# Patient Record
Sex: Female | Born: 1985 | Hispanic: Yes | Marital: Married | Smoking: Current every day smoker
Health system: Southern US, Community
[De-identification: ages and names within clinical notes are randomized; demographics above are authoritative.]

## PROBLEM LIST (undated history)

## (undated) DIAGNOSIS — J45909 Unspecified asthma, uncomplicated: Secondary | ICD-10-CM

## (undated) DIAGNOSIS — M5430 Sciatica, unspecified side: Secondary | ICD-10-CM

## (undated) DIAGNOSIS — M549 Dorsalgia, unspecified: Secondary | ICD-10-CM

## (undated) HISTORY — PX: TUBAL LIGATION: SHX77

## (undated) HISTORY — DX: Sciatica, unspecified side: M54.30

## (undated) HISTORY — DX: Unspecified asthma, uncomplicated: J45.909

## (undated) HISTORY — DX: Dorsalgia, unspecified: M54.9

---

## 2014-03-14 ENCOUNTER — Ambulatory Visit: Payer: Self-pay | Admitting: Physician Assistant

## 2014-03-14 LAB — URINALYSIS, COMPLETE
BILIRUBIN, UR: NEGATIVE
BLOOD: NEGATIVE
GLUCOSE, UR: NEGATIVE mg/dL (ref 0–75)
KETONE: NEGATIVE
LEUKOCYTE ESTERASE: NEGATIVE
Nitrite: NEGATIVE
PROTEIN: NEGATIVE
Ph: 6 (ref 4.5–8.0)
Specific Gravity: 1.025 (ref 1.003–1.030)

## 2014-03-14 LAB — PREGNANCY, URINE: PREGNANCY TEST, URINE: POSITIVE m[IU]/mL

## 2014-09-04 ENCOUNTER — Observation Stay: Payer: Self-pay | Admitting: Obstetrics and Gynecology

## 2014-10-31 ENCOUNTER — Inpatient Hospital Stay: Payer: Self-pay | Admitting: Obstetrics and Gynecology

## 2014-10-31 LAB — CBC WITH DIFFERENTIAL/PLATELET
BASOS ABS: 0 10*3/uL (ref 0.0–0.1)
Basophil %: 0.3 %
EOS ABS: 0.1 10*3/uL (ref 0.0–0.7)
Eosinophil %: 0.8 %
HCT: 32.3 % — ABNORMAL LOW (ref 35.0–47.0)
HGB: 10.5 g/dL — ABNORMAL LOW (ref 12.0–16.0)
Lymphocyte #: 3.4 10*3/uL (ref 1.0–3.6)
Lymphocyte %: 21.2 %
MCH: 31.2 pg (ref 26.0–34.0)
MCHC: 32.5 g/dL (ref 32.0–36.0)
MCV: 96 fL (ref 80–100)
Monocyte #: 1.3 x10 3/mm — ABNORMAL HIGH (ref 0.2–0.9)
Monocyte %: 7.9 %
NEUTROS PCT: 69.8 %
Neutrophil #: 11.1 10*3/uL — ABNORMAL HIGH (ref 1.4–6.5)
PLATELETS: 207 10*3/uL (ref 150–440)
RBC: 3.37 10*6/uL — ABNORMAL LOW (ref 3.80–5.20)
RDW: 13.5 % (ref 11.5–14.5)
WBC: 16 10*3/uL — ABNORMAL HIGH (ref 3.6–11.0)

## 2014-11-01 LAB — CBC WITH DIFFERENTIAL/PLATELET
BASOS PCT: 0.3 %
Basophil #: 0.2 10*3/uL — ABNORMAL HIGH (ref 0.0–0.1)
Basophil #: 0 10*3/uL (ref 0.0–0.1)
Basophil %: 1 %
EOS ABS: 0.1 10*3/uL (ref 0.0–0.7)
Eosinophil #: 0.1 10*3/uL (ref 0.0–0.7)
Eosinophil %: 0.8 %
Eosinophil %: 0.7 %
HCT: 20.5 % — ABNORMAL LOW (ref 35.0–47.0)
HCT: 20 % — AB (ref 35.0–47.0)
HGB: 6.7 g/dL — ABNORMAL LOW (ref 12.0–16.0)
HGB: 6.5 g/dL — AB (ref 12.0–16.0)
LYMPHS PCT: 20 %
Lymphocyte #: 2.3 10*3/uL (ref 1.0–3.6)
Lymphocyte #: 3.8 10*3/uL — ABNORMAL HIGH (ref 1.0–3.6)
Lymphocyte %: 13.3 %
MCH: 30.9 pg (ref 26.0–34.0)
MCH: 31 pg (ref 26.0–34.0)
MCHC: 32.4 g/dL (ref 32.0–36.0)
MCHC: 32.7 g/dL (ref 32.0–36.0)
MCV: 95 fL (ref 80–100)
MCV: 95 fL (ref 80–100)
MONO ABS: 1.5 x10 3/mm — AB (ref 0.2–0.9)
MONOS PCT: 8.6 %
Monocyte #: 1.6 x10 3/mm — ABNORMAL HIGH (ref 0.2–0.9)
Monocyte %: 8.9 %
Neutrophil #: 13.1 10*3/uL — ABNORMAL HIGH (ref 1.4–6.5)
Neutrophil #: 13.5 10*3/uL — ABNORMAL HIGH (ref 1.4–6.5)
Neutrophil %: 76 %
Neutrophil %: 70.4 %
PLATELETS: 162 10*3/uL (ref 150–440)
Platelet: 182 10*3/uL (ref 150–440)
RBC: 2.15 10*6/uL — ABNORMAL LOW (ref 3.80–5.20)
RBC: 2.11 10*6/uL — ABNORMAL LOW (ref 3.80–5.20)
RDW: 13.6 % (ref 11.5–14.5)
RDW: 13.5 % (ref 11.5–14.5)
WBC: 17.3 10*3/uL — AB (ref 3.6–11.0)
WBC: 19.2 10*3/uL — AB (ref 3.6–11.0)

## 2014-11-01 LAB — HEMATOCRIT: HCT: 22.1 % — ABNORMAL LOW (ref 35.0–47.0)

## 2014-11-01 LAB — APTT: Activated PTT: 28 secs (ref 23.6–35.9)

## 2014-11-01 LAB — PROTIME-INR
INR: 1
PROTHROMBIN TIME: 12.6 s (ref 11.5–14.7)

## 2014-11-02 LAB — HEMOGLOBIN: HGB: 10.1 g/dL — ABNORMAL LOW (ref 12.0–16.0)

## 2015-01-17 DIAGNOSIS — N946 Dysmenorrhea, unspecified: Secondary | ICD-10-CM | POA: Insufficient documentation

## 2015-01-17 DIAGNOSIS — R109 Unspecified abdominal pain: Secondary | ICD-10-CM | POA: Insufficient documentation

## 2015-01-17 DIAGNOSIS — R200 Anesthesia of skin: Secondary | ICD-10-CM | POA: Insufficient documentation

## 2015-01-17 DIAGNOSIS — M6740 Ganglion, unspecified site: Secondary | ICD-10-CM | POA: Insufficient documentation

## 2015-01-17 DIAGNOSIS — K5900 Constipation, unspecified: Secondary | ICD-10-CM | POA: Insufficient documentation

## 2015-01-17 DIAGNOSIS — Z98891 History of uterine scar from previous surgery: Secondary | ICD-10-CM | POA: Insufficient documentation

## 2015-01-17 DIAGNOSIS — R102 Pelvic and perineal pain: Secondary | ICD-10-CM | POA: Insufficient documentation

## 2015-01-17 DIAGNOSIS — O99210 Obesity complicating pregnancy, unspecified trimester: Secondary | ICD-10-CM | POA: Insufficient documentation

## 2015-02-09 LAB — SURGICAL PATHOLOGY

## 2015-02-15 NOTE — Op Note (Signed)
PATIENT NAME:  Leslie Rojas, Leslie Rojas MR#:  [id number] DATE OF BIRTH:  May 13, 1986  DATE OF PROCEDURE:  10/31/2014  PREOPERATIVE DIAGNOSES:  1.  History of prior cesarean section.  2.  Multiparity, desiring permanent sterilization.   POSTOPERATIVE DIAGNOSES: 1.  History of prior cesarean section.  2.  Multiparity, desiring permanent sterilization.     OPERATION: Repeat low transverse cesarean section with bilateral post partum sterilization.   ANESTHESIA: Spinal.   SURGEON: Hildred LaserAnika Jonuel Butterfield, MD   ESTIMATED BLOOD LOSS:  200 mL.  OPERATIVE FLUIDS: 1100 mL.   URINE OUTPUT: 200 mL.   COMPLICATIONS: None.   FINDINGS:  Infant cephalic presentation. There was clear amniotic fluid at rupture. Nuchal cord x 1 was noted which was reducible. Normal-appearing uterine outlying, tubes and ovaries.  Infant was 2880 grams, female, with Apgars of 9 and 10 at one and five minutes, respectively. Normal appearing placenta with 3 vessel cord.  SPECIMENS: Cord blood.   CONDITION: Stable   PROCEDURE: The patient was taken to the Operating Room where she was placed under spinal anesthesia without difficulty. She was then prepped and draped in normal sterile fashion and placed in the supine position with a left lateral tilt. After testing, spinal anesthesia was noted to be adequate. A Pfannenstiel skin incision was then made and carried down through the subcutaneous tissue to the fascia. Fascial incision was made and extended transversely. The fascia was separated from the underlying rectus tissue superiorly and inferiorly. The peritoneum was identified and entered. Peritoneal incision was extended longitudinally the bladder blade was then inserted.  The ureterovesical peritoneal reflection was then incised transversely and the bladder flap was bluntly freed from the lower uterine segment.  The bladder blade was then reinserted. Next, a low transverse uterine incision was made along the lower uterine segment with the scalpel.  The uterine incision was then extended laterally and superiorly bluntly. The bladder blade was removed and the infant's head was delivered atraumatically. There was a nuchal cord noted x 1 which was reducible. The nose and mouth were then suctioned. The infant was delivered from vertex presentation at 2880 grams with Apgars of 9 and 10.  Infant was handed off to the pediatricians in attendance after the umbilical cord was clamped and cut, cord blood was obtained for evaluation. The placenta was removed manually and intact and appeared normal. The uterus was exteriorized and cleared of all clots and debris. Uterine outlying tubes and ovaries appeared normal. The uterine incision was closed with a running locked suture of 0 Vicryl, a second suture of 0 Vicryl was used for an imbricating layer. Hemostasis was observed.   Attention was then turned to the patient's right fallopian tube which was then identified and grasped with Babcock clamp. The tube was then followed up to the fimbria. The Babcock clamp was then used to grasp the tube approximately 4 cm from the cornual region. A 3 cm segment of tube was then ligated with a free tie of plain gut using the Parkland technique and excised. The left fallopian tube was then ligated in a similar fashion and excised. There was some bleeding noted at the area of ligation, and so a 3-0 Vicryl was used to reapproximate the mesosalpinx in a running fashion. Hemostasis was observed at this point.  The uterus was then returned to the abdomen and lavage was carried out until clear.  The gutters were cleared of all clots and debris. The peritoneum was then closed using a 3-0 Vicryl in a running  fashion. The fascia was closed using a 1-0 Vicryl in a running fashion.  The skin was closed using a subcuticular stitch of 4-0 Monocryl.  LiquiBand was then applied over the incision. A Lidoderm patch was then placed above and below the incision.  The patient tolerated the procedure well.   She was transferred to the PACU in stable condition. All instrument, sponge, and needle counts were correct x 2 at the end of the procedure. She received 2 grams of Ancef prior to skin incision.       ____________________________ Jacques Earthly Valentino Saxon, MD asc:DT D: 11/01/2014 14:45:09 ET T: 11/01/2014 15:16:32 ET JOB#: [id number]  cc: Jacques Earthly. Valentino Saxon, MD, <Dictator> Fabian November MD ELECTRONICALLY SIGNED 11/10/2014 11:09

## 2015-03-23 ENCOUNTER — Telehealth: Payer: Self-pay | Admitting: Obstetrics and Gynecology

## 2015-03-23 DIAGNOSIS — R7989 Other specified abnormal findings of blood chemistry: Secondary | ICD-10-CM

## 2015-03-23 DIAGNOSIS — L659 Nonscarring hair loss, unspecified: Secondary | ICD-10-CM

## 2015-03-23 NOTE — Telephone Encounter (Signed)
This pt should not have been rescheduled, she needs to come in and discuss more long term management for pain instead of oxycodone. She needs to be placed back on the schedule, she is NOT getting any more narcotics until she is seen. Thanks.

## 2015-03-24 ENCOUNTER — Encounter: Payer: Self-pay | Admitting: Obstetrics and Gynecology

## 2015-03-30 ENCOUNTER — Telehealth: Payer: Self-pay | Admitting: Obstetrics and Gynecology

## 2015-03-30 NOTE — Telephone Encounter (Signed)
Patient needs to know if pain med has been called in or changed .Marland Kitchen Wants to pick it up today.Marland Kitchen

## 2015-03-31 NOTE — Telephone Encounter (Signed)
Dr.Cherry it was my understanding that this pt was not getting any more narcotics until she was seen by you to come up with a more long term plan, is that correct because the pt is calling asking about her script being ready. Please advise.

## 2015-03-31 NOTE — Telephone Encounter (Signed)
Per Dr.Cherry she would like this pt to be seen before giving any more pain medication. This pt was scheduled to come in this week for an annual but it was cancelled due to [employer], can we please add the pt back to the schedule for a medication follow up and apologize for the confusion. Thanks

## 2015-03-31 NOTE — Telephone Encounter (Signed)
Yes, that is correct. However patient was supposed to be on the schedule for tomorrow, but was moved out to August (due to [facility]...), so either we need to have the patient's appointment moved back up to a sooner date (which I would prefer), or I am going to have to prescribe medication until August.

## 2015-04-02 ENCOUNTER — Ambulatory Visit (INDEPENDENT_AMBULATORY_CARE_PROVIDER_SITE_OTHER): Payer: Medicaid Other | Admitting: Obstetrics and Gynecology

## 2015-04-02 ENCOUNTER — Encounter: Payer: Self-pay | Admitting: Obstetrics and Gynecology

## 2015-04-02 VITALS — BP 108/68 | HR 71 | Ht 59.0 in | Wt 155.3 lb

## 2015-04-02 DIAGNOSIS — L658 Other specified nonscarring hair loss: Secondary | ICD-10-CM | POA: Diagnosis not present

## 2015-04-02 DIAGNOSIS — M5442 Lumbago with sciatica, left side: Secondary | ICD-10-CM

## 2015-04-02 MED ORDER — OXYCODONE-ACETAMINOPHEN 7.5-325 MG PO TABS: 1.0000 | ORAL_TABLET | Freq: Four times a day (QID) | ORAL | Status: DC | PRN

## 2015-04-03 LAB — ANA: Anti Nuclear Antibody(ANA): NEGATIVE

## 2015-04-03 LAB — TSH: TSH: 4.59 u[IU]/mL — AB (ref 0.450–4.500)

## 2015-04-04 ENCOUNTER — Encounter: Payer: Self-pay | Admitting: Obstetrics and Gynecology

## 2015-04-04 NOTE — Patient Instructions (Signed)
To be scheduled for X-ray of back. Return in 2 weeks to discuss results.

## 2015-04-04 NOTE — Progress Notes (Signed)
GYNECOLOGY PROGRESS NOTE  Subjective:    Patient ID: Leslie Rojas, female    DOB: 02-14-1986, 29 y.o.   MRN: [id number]  HPI  Patient is a 29 y.o. P65 female who presents for refill of pain medication and discussion of long term pain management.  Patient notes that the pain in her mid and lower back is getting worse, and that the numbness has moved to both lower extremities (initally was unilateral).  Pain has been ongoing since the birth of her second child, but has worsened since deilivery of her third child.   Sh reports having to take pain medication just to get out of bed in the mornings.  Has difficulty lifting her infant.  Also reports hair loss over past several months, noticing increased strands in her brush and balding along right temporal region.   The following portions of the patient's history were reviewed and updated as appropriate: allergies, current medications, past family history, past medical history, past social history, past surgical history and problem list.  Review of Systems Pertinent items are noted in HPI.   Objective:   Blood pressure 108/68, pulse 71, height 4\' 11"  (1.499 m), weight 155 lb 4.8 oz (70.444 kg), last menstrual period 03/04/2015. General appearance: alert, cooperative and no distress Abdomen: soft, non-tender; bowel sounds normal; no masses,  no organomegaly  Back: Back: symmetric, no curvature. ROM normal. No CVA tenderness., positive point tenderness along midline of back from lower thoracic to lumbar regions Pelvic: deferred Extremities: extremities normal, atraumatic, no cyanosis or edema Neurologic: Sensory: normal Coordination: normal Gait: Normal, although does report occasional shuffling as legs feel extremely heavy.  Motor: Patient with decreased leg lift bilaterally, strength 2/5. Negative straight leg test.   Assessment:   Chronic low back pain, with worsening numbness of lower extremities Hair loss  Plan:  - With  worsening back pain, will get X-ray of thoracic and lumbar spine.  Patient notes that she tried to see a chiropractor, however did not like the way she was treated and did not return. Also reports remote history of being told that she had a bulging disc in her spine.  Informed patient that this could have worsened after most recent pregnancy.  - Will order TSH and ANA for recent h/o new onset hair loss.  - Will refill medications.  Patient notes that she is now having to take 2 Percocets as taking 1 no longer eases the pain. However she ends up running out ofh her pills a few dase before time for scheduled refill.  Will change prescription to last patient through the month.   RTC in 2 weeks to discuss results    Hildred Laser, MD [facility]

## 2015-04-06 ENCOUNTER — Telehealth: Payer: Self-pay

## 2015-04-06 DIAGNOSIS — R7989 Other specified abnormal findings of blood chemistry: Secondary | ICD-10-CM

## 2015-04-06 NOTE — Telephone Encounter (Signed)
Pt aware. States she will be in tomorrow for lab work.

## 2015-04-06 NOTE — Telephone Encounter (Signed)
-----   Message from Hildred Laser, MD sent at 04/04/2015  7:58 AM EDT ----- Please inform patient that her thyroid screening was elevated, needs to return for TSH panel. Lupus screen negative.

## 2015-04-07 ENCOUNTER — Other Ambulatory Visit: Payer: Medicaid Other

## 2015-04-07 DIAGNOSIS — M5442 Lumbago with sciatica, left side: Secondary | ICD-10-CM

## 2015-04-07 DIAGNOSIS — R7989 Other specified abnormal findings of blood chemistry: Secondary | ICD-10-CM

## 2015-04-07 NOTE — Telephone Encounter (Signed)
Done came in for bloodwork

## 2015-04-08 LAB — THYROID PANEL
Free Thyroxine Index: 1.5 (ref 1.2–4.9)
T3 Uptake Ratio: 24 % (ref 24–39)
T4, Total: 6.1 ug/dL (ref 4.5–12.0)

## 2015-04-08 NOTE — Telephone Encounter (Signed)
-----   Message from Hildred Laser, MD sent at 04/08/2015  9:07 AM EDT ----- Please inform patient that further thyroid workup was negative, but will need to retest in about 3 months.

## 2015-04-08 NOTE — Telephone Encounter (Signed)
Pt informed that further thyroid levels were negative, and to follow up in 57mos for another blood draw.

## 2015-04-16 ENCOUNTER — Ambulatory Visit: Payer: Medicaid Other | Admitting: Obstetrics and Gynecology

## 2015-04-27 ENCOUNTER — Telehealth: Payer: Self-pay | Admitting: Obstetrics and Gynecology

## 2015-04-27 DIAGNOSIS — R200 Anesthesia of skin: Secondary | ICD-10-CM

## 2015-04-27 DIAGNOSIS — M5430 Sciatica, unspecified side: Secondary | ICD-10-CM

## 2015-04-27 NOTE — Telephone Encounter (Signed)
NEEDS RX  REFILL ON OXYCODONE PLEASE

## 2015-04-28 NOTE — Telephone Encounter (Signed)
Spoke with pt and advised her that per Dr.Cherry she needs to begin seeing pain management for pain medications. Advised pt that referral would be made. Pt requests an RX to hold her over until she is seen by pain clinic. Advised pt that I would consult with physician regarding this. Please advise.

## 2015-04-29 ENCOUNTER — Other Ambulatory Visit: Payer: Self-pay | Admitting: Obstetrics and Gynecology

## 2015-04-29 DIAGNOSIS — R202 Paresthesia of skin: Secondary | ICD-10-CM

## 2015-04-29 MED ORDER — OXYCODONE-ACETAMINOPHEN 7.5-325 MG PO TABS: 1.0000 | ORAL_TABLET | Freq: Four times a day (QID) | ORAL | Status: DC | PRN

## 2015-04-29 NOTE — Progress Notes (Signed)
Patient desires refill on pain medication.  Did not attend pelvic floor physical therapy as she noted that her insurance would only cover 1 visit and patient did not think that she would get any benefit.  Referral to pain management has been placed.  Still needs to get back X-ray.  Will give refill, encouraged patient to have imaging performed.  Can also refer to Neurologist.

## 2015-04-30 NOTE — Telephone Encounter (Signed)
Medication refilled. Patient came to pick up prescription.  Also made referral to Neurology.   Hildred LaserAnika Lashandra Arauz, MD [facility] 04/30/2015 1:57 PM

## 2015-05-01 ENCOUNTER — Ambulatory Visit
Admission: EM | Admit: 2015-05-01 | Discharge: 2015-05-01 | Disposition: A | Discharge status: Home / Self Care | Payer: Medicaid Other | Attending: Family Medicine | Admitting: Family Medicine

## 2015-05-01 DIAGNOSIS — L509 Urticaria, unspecified: Secondary | ICD-10-CM

## 2015-05-01 MED ORDER — PREDNISONE 20 MG PO TABS: 20.0000 mg | ORAL_TABLET | Freq: Every day | ORAL | Status: DC

## 2015-05-01 MED ORDER — ALBUTEROL SULFATE HFA 108 (90 BASE) MCG/ACT IN AERS: 1.0000 | INHALATION_SPRAY | Freq: Four times a day (QID) | RESPIRATORY_TRACT | Status: AC | PRN

## 2015-05-01 NOTE — ED Notes (Signed)
Discharged by Dr. Judd Gaudieronty

## 2015-05-01 NOTE — ED Notes (Signed)
Complaints of generalized itchy rash on arms, legs, back and abdomen since Monday. Think she got allergic to some food on Monday night. Had shortness of breath last 2 night; had to use her son's albuterol inhaler twice. Currently no SOB but chest feels tight.

## 2015-05-01 NOTE — ED Provider Notes (Signed)
CSN: [id number]     Arrival date & time 05/01/15  1112 History   First MD Initiated Contact with Patient 05/01/15 1149     Chief Complaint  Patient presents with  . Allergic Reaction   (Consider location/radiation/quality/duration/timing/severity/associated sxs/prior Treatment) HPI Comments: 29 yo female with a 4-5 day h/o hives on her body. Patient states [facility] and thinks it may have been due to a cookie she ate there. Hives are itchy and has also noticed some wheezing and shortness of breath and has used her son's albuterol for this. Has also been taking Benadryl, claritin and zantac. Denies any facial swelling.   Patient is a 29 y.o. female presenting with allergic reaction. The history is provided by the patient.  Allergic Reaction   No past medical history on file. Past Surgical History  Procedure Laterality Date  . Cesarean section     No family history on file. History  Substance Use Topics  . Smoking status: Current Every Day Smoker    Types: Cigarettes  . Smokeless tobacco: Not on file  . Alcohol Use: No   OB History    No data available     Review of Systems  Allergies  Ceclor  Home Medications   Prior to Admission medications   Medication Sig Start Date End Date Taking? Authorizing Provider  diphenhydrAMINE (SOMINEX) 25 MG tablet Take 50 mg by mouth at bedtime as needed for sleep.   Yes Historical Provider, MD  hydrocortisone cream 1 % Apply 1 application topically 2 (two) times daily.   Yes Historical Provider, MD  albuterol (PROVENTIL HFA;VENTOLIN HFA) 108 (90 BASE) MCG/ACT inhaler Inhale 1-2 puffs into the lungs every 6 (six) hours as needed for wheezing or shortness of breath. 05/01/15   Payton Mccallumrlando Jayant Kriz, MD  predniSONE (DELTASONE) 20 MG tablet Take 1 tablet (20 mg total) by mouth daily. 05/01/15   Payton Mccallumrlando Dusten Ellinwood, MD   BP 107/69 mmHg  Pulse 98  Temp(Src) 97.8 F (36.6 C) (Tympanic)  Resp 18  Ht 5' (1.524 m)  Wt 155 lb  (70.308 kg)  BMI 30.27 kg/m2  SpO2 97% Physical Exam  Constitutional: She appears well-developed and well-nourished. No distress.  Cardiovascular: Normal rate, regular rhythm and normal heart sounds.   Pulmonary/Chest: Effort normal and breath sounds normal. No respiratory distress. She has no wheezes. She has no rales.  Skin: Rash noted. Rash is urticarial (mainly on lower and upper extremities; few urticarial wheals on trunk). She is not diaphoretic.  Vitals reviewed.   ED Course  Procedures (including critical care time) Labs Review Labs Reviewed - No data to display  Imaging Review No results found.   MDM   1. Urticaria    Discharge Medication List as of 05/01/2015 12:06 PM    START taking these medications   Details  albuterol (PROVENTIL HFA;VENTOLIN HFA) 108 (90 BASE) MCG/ACT inhaler Inhale 1-2 puffs into the lungs every 6 (six) hours as needed for wheezing or shortness of breath., Starting 05/01/2015, Until Discontinued, Normal    predniSONE (DELTASONE) 20 MG tablet Take 1 tablet (20 mg total) by mouth daily., Starting 05/01/2015, Until Discontinued, Normal      Plan: 1. diagnosis reviewed with patient 2. rx as per orders; risks, benefits, potential side effects reviewed with patient 3. Recommend supportive treatment with otc antihistamines and H2 blockers 4. F/u prn if symptoms worsen or don't improve    Payton Mccallumrlando Jacarie Pate, MD 05/01/15 1316

## 2015-05-04 ENCOUNTER — Encounter: Payer: Self-pay | Admitting: Obstetrics and Gynecology

## 2015-05-26 ENCOUNTER — Encounter: Payer: Self-pay | Admitting: Obstetrics and Gynecology

## 2015-05-26 ENCOUNTER — Ambulatory Visit (INDEPENDENT_AMBULATORY_CARE_PROVIDER_SITE_OTHER): Payer: Medicaid Other | Admitting: Obstetrics and Gynecology

## 2015-05-26 VITALS — BP 109/69 | HR 77 | Ht 59.0 in | Wt 156.1 lb

## 2015-05-26 DIAGNOSIS — R946 Abnormal results of thyroid function studies: Secondary | ICD-10-CM

## 2015-05-26 DIAGNOSIS — G8929 Other chronic pain: Secondary | ICD-10-CM

## 2015-05-26 DIAGNOSIS — L659 Nonscarring hair loss, unspecified: Secondary | ICD-10-CM

## 2015-05-26 DIAGNOSIS — Z0000 Encounter for general adult medical examination without abnormal findings: Secondary | ICD-10-CM

## 2015-05-26 DIAGNOSIS — Z01419 Encounter for gynecological examination (general) (routine) without abnormal findings: Secondary | ICD-10-CM

## 2015-05-26 DIAGNOSIS — M545 Low back pain: Secondary | ICD-10-CM

## 2015-05-26 DIAGNOSIS — R7989 Other specified abnormal findings of blood chemistry: Secondary | ICD-10-CM

## 2015-05-26 MED ORDER — OXYCODONE-ACETAMINOPHEN 7.5-325 MG PO TABS: 1.0000 | ORAL_TABLET | Freq: Four times a day (QID) | ORAL | Status: DC | PRN

## 2015-05-26 MED ORDER — DOCUSATE SODIUM 100 MG PO CAPS: 100.0000 mg | ORAL_CAPSULE | Freq: Two times a day (BID) | ORAL | Status: AC

## 2015-05-26 MED ORDER — OXYCODONE-ACETAMINOPHEN 7.5-325 MG PO TABS
1.0000 | ORAL_TABLET | Freq: Four times a day (QID) | ORAL | Status: DC | PRN
Start: 2015-05-26 — End: 2015-06-23

## 2015-05-26 NOTE — Progress Notes (Signed)
AE Needs refill of percocet Having migraines 2 x a week appt with  pain clinic 9/15 Last pap- 04/2014 ascus/neg  TL- b/c

## 2015-05-26 NOTE — Progress Notes (Signed)
Subjective:    Leslie Rojas is a 29 y.o. G30P3003 female who presents for an annual exam. The patient has no major complaints today. The patient is sexually active. GYN screening history: last pap: approximate date 04/2014 and was abnormal: ASCUS HR HPV neg. The patient wears seatbelts: yes. The patient participates in regular exercise: no. Has the patient ever been transfused or tattooed?: no. The patient reports that there is not domestic violence in her life.   Menstrual History: Obstetric History   G3   P3   T3   P0   A0   TAB0   SAB0   E0   M0   L3     # Outcome Date GA Lbr Len/2nd Weight Sex Delivery Anes PTL Lv  3 Term 10/2014 [redacted]w[redacted]d  6 lb 5.6 oz (2.88 kg) F CS-LTranv Spinal N Y     Complications: Postpartum hemorrhage     Apgar1:  9                Apgar5: 9  2 Term 05/2011 [redacted]w[redacted]d  5 lb 11 oz (2.58 kg) M CS-LTranv  N Y     Complications: Fetal Intolerance  1 Term 11/2009 [redacted]w[redacted]d  6 lb 11 oz (3.033 kg) M Vag-Spont  N Y      Menarche age: 57 Patient's last menstrual period was 04/24/2015 (exact date).  Denies h/o STIs.    Past Medical History  Diagnosis Date  . Sciatic pain   . Back pain   . Asthma     Past Surgical History  Procedure Laterality Date  . Tubal ligation    . Cesarean section      Family History  Problem Relation Age of Onset  . Diabetes Mother   . Asthma Mother   . Thyroid disease Mother   . Heart disease Father     History   Social History  . Marital Status: Married    Spouse Name: N/A  . Number of Children: N/A  . Years of Education: N/A   Occupational History  . Not on file.   Social History Main Topics  . Smoking status: Current Every Day Smoker -- 0.25 packs/day    Types: Cigarettes  . Smokeless tobacco: Not on file  . Alcohol Use: No  . Drug Use: No  . Sexual Activity: Yes    Birth Control/ Protection: Surgical   Other Topics Concern  . Not on file   Social History Narrative   ** Merged History Encounter **         Outpatient Encounter Prescriptions as of 05/26/2015  Medication Sig Note  . albuterol (PROVENTIL HFA;VENTOLIN HFA) 108 (90 BASE) MCG/ACT inhaler Inhale 1-2 puffs into the lungs every 6 (six) hours as needed for wheezing or shortness of breath.   . oxyCODONE-acetaminophen (PERCOCET) 7.5-325 MG per tablet Take 1 tablet by mouth every 6 (six) hours as needed for severe pain (May take up to 2 tablets as needed for pain.).     Review of Systems Constitutional: negative for chills, fatigue, fevers and sweats Eyes: negative for irritation, redness and visual disturbance Ears, nose, mouth, throat, and face: negative for hearing loss, nasal congestion, snoring and tinnitus Respiratory: negative for asthma, cough, sputum Cardiovascular: negative for chest pain, dyspnea, exertional chest pressure/discomfort, irregular heart beat, palpitations and syncope Gastrointestinal: negative for abdominal pain, change in bowel habits, nausea and vomiting Genitourinary: negative for abnormal menstrual periods, genital lesions, sexual problems and vaginal discharge, dysuria and urinary incontinence Integument/breast:  negative for breast lump, breast tenderness and nipple discharge Hematologic/lymphatic: negative for bleeding and easy bruising Musculoskeletal: positive for back pain and sciatic pain with numbness and tingling;  Negative for muscle weakness Neurological: positive for headaches (new onset migraines over pats 3 months, takes Excedrin);  negative for dizziness, vertigo and weakness Endocrine: negative for diabetic symptoms including polydipsia, polyuria and skin dryness Allergic/Immunologic: negative for hay fever and urticaria     Objective:     Assessment:   Female annual exam.   Sciatica  Chronic back pain New onset migraines   Plan:     All questions answered. Blood tests: Basic metabolic panel, CBC with diff, Free T4 level, Lipoproteins, HbgbA1c and TSH. Breast self exam technique  reviewed and patient encouraged to perform self-exam monthly. Contraception: tubal ligation. Discussed healthy lifestyle modifications.   Pap smear ASCUS HR HPV neg in 2015.  Can have routine f /u paps q 3 years.  Declines STI testing.  Sciatica and chronic back pain - still needs X-ray, notes that she was never contacted regarding scheduling appointment.  New referral given. Requests refill on pain medication.  Will prescribe.  Has appointments to pain clinic and neurologist next month .  Can also f/u migraines with Neurologist. Currently taking Excedrin for migraines.  RTC in 1 year.

## 2015-05-27 LAB — BASIC METABOLIC PANEL
BUN/Creatinine Ratio: 12 (ref 8–20)
BUN: 7 mg/dL (ref 6–20)
CALCIUM: 9.5 mg/dL (ref 8.7–10.2)
CO2: 22 mmol/L (ref 18–29)
Chloride: 99 mmol/L (ref 97–108)
Creatinine, Ser: 0.57 mg/dL (ref 0.57–1.00)
GFR calc non Af Amer: 124 mL/min/{1.73_m2} (ref >59)
GFR, EST AFRICAN AMERICAN: 143 mL/min/{1.73_m2} (ref >59)
Glucose: 87 mg/dL (ref 65–99)
Potassium: 4.2 mmol/L (ref 3.5–5.2)
SODIUM: 136 mmol/L (ref 134–144)

## 2015-05-27 LAB — CBC
Hematocrit: 38.8 % (ref 34.0–46.6)
Hemoglobin: 13.2 g/dL (ref 11.1–15.9)
MCH: 30.9 pg (ref 26.6–33.0)
MCHC: 34 g/dL (ref 31.5–35.7)
MCV: 91 fL (ref 79–97)
PLATELETS: 351 10*3/uL (ref 150–379)
RBC: 4.27 x10E6/uL (ref 3.77–5.28)
RDW: 13.1 % (ref 12.3–15.4)
WBC: 12.2 10*3/uL — AB (ref 3.4–10.8)

## 2015-05-27 LAB — LIPID PANEL
CHOL/HDL RATIO: 5.1 ratio — AB (ref 0.0–4.4)
Cholesterol, Total: 180 mg/dL (ref 100–199)
HDL: 35 mg/dL — AB (ref >39)
LDL Calculated: 117 mg/dL — ABNORMAL HIGH (ref 0–99)
TRIGLYCERIDES: 141 mg/dL (ref 0–149)
VLDL Cholesterol Cal: 28 mg/dL (ref 5–40)

## 2015-05-27 LAB — THYROID PANEL WITH TSH
Free Thyroxine Index: 2 (ref 1.2–4.9)
T3 UPTAKE RATIO: 26 % (ref 24–39)
T4, Total: 7.6 ug/dL (ref 4.5–12.0)
TSH: 3.06 u[IU]/mL (ref 0.450–4.500)

## 2015-06-02 ENCOUNTER — Telehealth: Payer: Self-pay

## 2015-06-02 NOTE — Telephone Encounter (Signed)
-----   Message from Hildred Laser, MD sent at 06/01/2015  3:18 PM EDT ----- Please inform patient of normal annual exam labs except cholesterol mildly elevated.  Can be controlled with low cholesterol diet and exercise.  Thyroid studies normal.

## 2015-06-03 NOTE — Telephone Encounter (Signed)
Pt aware.

## 2015-06-23 ENCOUNTER — Other Ambulatory Visit: Payer: Self-pay | Admitting: Obstetrics and Gynecology

## 2015-06-23 MED ORDER — OXYCODONE-ACETAMINOPHEN 7.5-325 MG PO TABS: 1.0000 | ORAL_TABLET | Freq: Four times a day (QID) | ORAL | Status: AC | PRN

## 2015-06-23 NOTE — Telephone Encounter (Signed)
Patient called requesting a refill on percocet.Thanks °

## 2015-06-23 NOTE — Telephone Encounter (Signed)
Please let patient know that I have left a refill at the front for her to pick up.  Also, I have tried twice to have this patient scheduled for an x-ray of her back, both requests are still sitting in [employer] as a future order.  Is there a reason why this is not getting scheduled?

## 2015-06-24 NOTE — Telephone Encounter (Signed)
Patients do not need to be scheduled for xrays they can just go over to the imaging center as long as the order is in [facility]

## 2015-06-25 NOTE — Telephone Encounter (Signed)
Oh. I thought that it was like the other imaging where it needed to be scheduled.  Can we inform her that she just needs to go to the imaging center then?  Thanks,   Dr. Valentino Saxon

## 2015-06-29 ENCOUNTER — Ambulatory Visit: Payer: Self-pay | Admitting: Neurology

## 2015-06-29 ENCOUNTER — Telehealth: Payer: Self-pay | Admitting: Obstetrics and Gynecology

## 2015-06-29 NOTE — Telephone Encounter (Signed)
This pt was rude. She said she had a cold and rash. I ask if she was pregnant and she said no,. I told her we did not do primary care here b/c that would fall under primary and suggested she go to urgent care or primary dr. She got mad and said she doesn't have a primary dr and that's what she uses [facility] for. She said to ask you to call her in an antibiotic amoxicillan or penicillin. I am doing what the pt asked. Sorry!!!

## 2015-06-29 NOTE — Telephone Encounter (Signed)
Called pt advised her that per Dr.Cherry she needs to find a PCP who can handle these issues for her. Pt gives verbal understanding and states that she will look into finding a provider.

## 2015-07-01 ENCOUNTER — Ambulatory Visit: Payer: Medicaid Other | Attending: Anesthesiology | Admitting: Anesthesiology

## 2015-07-01 ENCOUNTER — Encounter: Payer: Self-pay | Admitting: Anesthesiology

## 2015-07-01 VITALS — BP 110/83 | HR 82 | Temp 98.6°F | Resp 18 | Ht 59.0 in | Wt 160.0 lb

## 2015-07-01 DIAGNOSIS — F32A Depression, unspecified: Secondary | ICD-10-CM | POA: Insufficient documentation

## 2015-07-01 DIAGNOSIS — M51379 Other intervertebral disc degeneration, lumbosacral region without mention of lumbar back pain or lower extremity pain: Secondary | ICD-10-CM | POA: Insufficient documentation

## 2015-07-01 DIAGNOSIS — D649 Anemia, unspecified: Secondary | ICD-10-CM | POA: Diagnosis not present

## 2015-07-01 DIAGNOSIS — F329 Major depressive disorder, single episode, unspecified: Secondary | ICD-10-CM | POA: Diagnosis not present

## 2015-07-01 DIAGNOSIS — M5137 Other intervertebral disc degeneration, lumbosacral region: Secondary | ICD-10-CM | POA: Insufficient documentation

## 2015-07-01 DIAGNOSIS — M797 Fibromyalgia: Secondary | ICD-10-CM | POA: Insufficient documentation

## 2015-07-01 DIAGNOSIS — M545 Low back pain: Secondary | ICD-10-CM | POA: Insufficient documentation

## 2015-07-01 DIAGNOSIS — M5136 Other intervertebral disc degeneration, lumbar region: Secondary | ICD-10-CM | POA: Insufficient documentation

## 2015-07-01 DIAGNOSIS — D509 Iron deficiency anemia, unspecified: Secondary | ICD-10-CM

## 2015-07-01 DIAGNOSIS — M549 Dorsalgia, unspecified: Secondary | ICD-10-CM | POA: Diagnosis present

## 2015-07-01 DIAGNOSIS — J45909 Unspecified asthma, uncomplicated: Secondary | ICD-10-CM | POA: Insufficient documentation

## 2015-07-01 DIAGNOSIS — G8929 Other chronic pain: Secondary | ICD-10-CM | POA: Diagnosis not present

## 2015-07-01 DIAGNOSIS — M5416 Radiculopathy, lumbar region: Secondary | ICD-10-CM | POA: Diagnosis not present

## 2015-07-01 DIAGNOSIS — J4520 Mild intermittent asthma, uncomplicated: Secondary | ICD-10-CM

## 2015-07-01 DIAGNOSIS — M5441 Lumbago with sciatica, right side: Secondary | ICD-10-CM

## 2015-07-01 MED ORDER — MELOXICAM 7.5 MG PO TABS: 7.5000 mg | ORAL_TABLET | Freq: Two times a day (BID) | ORAL | Status: AC

## 2015-07-01 NOTE — Progress Notes (Signed)
Subjective:    Patient ID: Leslie Rojas, female    DOB: August 20, 1986, 29 y.o.   MRN: [id number]  Back Pain This is a chronic problem. The current episode started more than 1 year ago. The problem occurs intermittently. The problem has been gradually worsening since onset. The pain is present in the lumbar spine. The quality of the pain is described as aching, cramping and shooting. The pain radiates to the right thigh, left thigh, right foot and right knee. The pain is at a severity of 9/10. The pain is severe. The pain is worse during the day. The symptoms are aggravated by bending, sitting, standing and twisting. Stiffness is present at night. Associated symptoms include leg pain, numbness and paresthesias. Pertinent negatives include no abdominal pain, bladder incontinence, bowel incontinence, chest pain, dysuria, headaches, paresis, pelvic pain, perianal numbness, weakness or weight loss. Risk factors include lack of exercise, pregnancy and sedentary lifestyle. She has tried analgesics, bed rest and walking for the symptoms. The treatment provided no relief.  Knee Pain  The incident occurred more than 1 week ago. Incident location: The patient started having knee pain since the delivery of her first child. There was no injury mechanism. The pain is at a severity of 9/10. Associated symptoms include numbness.  Foot Pain Associated symptoms include arthralgias, fatigue, joint swelling, myalgias and numbness. Pertinent negatives include no abdominal pain, chest pain, chills, congestion, coughing, diaphoresis, headaches, nausea, neck pain, rash, vomiting or weakness.      Review of Systems  Constitutional: Positive for fatigue. Negative for chills, weight loss, diaphoresis, activity change, appetite change and unexpected weight change.  HENT: Negative.  Negative for congestion, dental problem, drooling, ear discharge, ear pain, facial swelling, hearing loss, mouth sores, nosebleeds,  postnasal drip, rhinorrhea and sinus pressure.   Eyes: Negative.   Respiratory: Negative.  Negative for apnea, cough, choking, chest tightness, shortness of breath, wheezing and stridor.   Cardiovascular: Negative.  Negative for chest pain, palpitations and leg swelling.  Gastrointestinal: Negative.  Negative for nausea, vomiting, abdominal pain, diarrhea, constipation, blood in stool, abdominal distention, anal bleeding, rectal pain and bowel incontinence.  Endocrine: Negative.  Negative for cold intolerance, heat intolerance, polydipsia, polyphagia and polyuria.  Genitourinary: Negative for bladder incontinence, dysuria, urgency, frequency, hematuria, flank pain, decreased urine volume, vaginal bleeding, vaginal discharge, enuresis, genital sores, vaginal pain, menstrual problem and pelvic pain.  Musculoskeletal: Positive for myalgias, back pain, joint swelling, arthralgias and gait problem. Negative for neck pain.       Patient indicated that since the delivery of her first son she's developed pains in the back and this is radiated down the legs into the knees into her hands and into her feet and with subsequent deliveries her pains have become more widespread and worse She has a maternal family history of fibromyalgia  Skin: Negative for color change, pallor, rash and wound.  Allergic/Immunologic: Negative.  Negative for environmental allergies, food allergies and immunocompromised state.  Neurological: Positive for numbness and paresthesias. Negative for dizziness, tremors, seizures, syncope, facial asymmetry, speech difficulty, weakness, light-headedness and headaches.       The patient had numbness of the foot and that numbness was greatest in the right fourth and fifth toes  Hematological: Negative.   Psychiatric/Behavioral: Negative.        Objective:   Physical Exam  Constitutional: She is oriented to person, place, and time. She appears well-developed and well-nourished. No distress.   Patient looks slightly anemic and there were  blue blotches on her hands and forearms bilaterally  HENT:  Head: Normocephalic and atraumatic.  Right Ear: External ear normal.  Left Ear: External ear normal.  Nose: Nose normal.  Mouth/Throat: Oropharynx is clear and moist.  Eyes: Conjunctivae and EOM are normal. Pupils are equal, round, and reactive to light. Right eye exhibits no discharge. Left eye exhibits no discharge. No scleral icterus.  Neck: Normal range of motion. Neck supple. No JVD present. No tracheal deviation present. No thyromegaly present.  Cardiovascular: Normal rate, regular rhythm, normal heart sounds and intact distal pulses.  Exam reveals no gallop and no friction rub.   No murmur heard. Blood pressure was 110/83 mmHg Her pulse was 82 bpm equal and when Heart sounds 1 and 2 were heard in all areas and there were no audible murmurs Temperature was 98.32F Respirations were 18 breaths per minute  SPO2 was 99%   Pulmonary/Chest: Effort normal and breath sounds normal. No respiratory distress. She has no wheezes. She has no rales. She exhibits no tenderness.  Although patient has a history of asthma and takes albuterol her chest is clinically clear and there was no evidence of any rhonchi  Abdominal: Soft. Bowel sounds are normal. She exhibits no distension and no mass. There is no tenderness. There is no rebound and no guarding.  Genitourinary:  Genitourinary examination was deferred  Musculoskeletal: She exhibits edema and tenderness.  Patient's range of motion was significantly decreased. Her straight leg raising test on the left side was 60 Straight leg raising test on the right side was 30 Neurological evaluation using light touch and pinprick were normal Torsion test was positive on the right side and that was indicative of lumbar facetogenic disease  Neurological: She is alert and oriented to person, place, and time. She has normal reflexes. She displays normal  reflexes. No cranial nerve deficit. She exhibits normal muscle tone. Coordination normal.  Skin: Skin is warm and dry. No rash noted. She is not diaphoretic. No erythema. No pallor.  Psychiatric: She has a normal mood and affect. Her behavior is normal. Judgment and thought content normal.  Nursing note and vitals reviewed.  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 She is currently scheduled to see a neurologist in the next 2 weeks       Assessment & Plan:    Assessment 1 chronic low back pain 2 lumbar degenerative disc disease 3 lumbar radiculopathy 4 fibromyalgia 5 lumbar facetogenic disease 6 asthma 7 anemia 8 depression   Plan of management 1 Will plan to perform a caudal epidural steroid injection for this patient next Friday 2 Will plan for imaging studies if she does not get dramatic relief from the caudal epidural steroid injection 3 Will consider intravenous lidocaine infusion to treat her fibromyalgia  4 Will begin her on mat meloxicam  7.5 mg twice a day after meals 5 Will request that she bring the letter from her primary care physician indicating that he or she would meet no longer prescribing opioids for her.   New patient    level 4   Tod Persia M.D.

## 2015-07-01 NOTE — Patient Instructions (Signed)
Epidural Steroid Injection Patient Information  Description: The epidural space surrounds the nerves as they exit the spinal cord.  In some patients, the nerves can be compressed and inflamed by a bulging disc or a tight spinal canal (spinal stenosis).  By injecting steroids into the epidural space, we can bring irritated nerves into direct contact with a potentially helpful medication.  These steroids act directly on the irritated nerves and can reduce swelling and inflammation which often leads to decreased pain.  Epidural steroids may be injected anywhere along the spine and from the neck to the low back depending upon the location of your pain.   After numbing the skin with local anesthetic (like Novocaine), a small needle is passed into the epidural space slowly.  You may experience a sensation of pressure while this is being done.  The entire block usually last less than 10 minutes.  Conditions which may be treated by epidural steroids:   Low back and leg pain  Neck and arm pain  Spinal stenosis  Post-laminectomy syndrome  Herpes zoster (shingles) pain  Pain from compression fractures  Preparation for the injection:  1. Do not eat any solid food or dairy products within 6 hours of your appointment.  2. You may drink clear liquids up to 2 hours before appointment.  Clear liquids include water, black coffee, juice or soda.  No milk or cream please. 3. You may take your regular medication, including pain medications, with a sip of water before your appointment  Diabetics should hold regular insulin (if taken separately) and take 1/2 normal NPH dos the morning of the procedure.  Carry some sugar containing items with you to your appointment. 4. A driver must accompany you and be prepared to drive you home after your procedure.  5. Bring all your current medications with your. 6. An IV may be inserted and sedation may be given at the discretion of the physician.   7. A blood pressure  cuff, EKG and other monitors will often be applied during the procedure.  Some patients may need to have extra oxygen administered for a short period. 8. You will be asked to provide medical information, including your allergies, prior to the procedure.  We must know immediately if you are taking blood thinners (like Coumadin/Warfarin)  Or if you are allergic to IV iodine contrast (dye). We must know if you could possible be pregnant.  Possible side-effects:  Bleeding from needle site  Infection (rare, may require surgery)  Nerve injury (rare)  Numbness & tingling (temporary)  Difficulty urinating (rare, temporary)  Spinal headache ( a headache worse with upright posture)  Light -headedness (temporary)  Pain at injection site (several days)  Decreased blood pressure (temporary)  Weakness in arm/leg (temporary)  Pressure sensation in back/neck (temporary)  Call if you experience:  Fever/chills associated with headache or increased back/neck pain.  Headache worsened by an upright position.  New onset weakness or numbness of an extremity below the injection site  Hives or difficulty breathing (go to the emergency room)  Inflammation or drainage at the infection site  Severe back/neck pain  Any new symptoms which are concerning to you  Please note:  Although the local anesthetic injected can often make your back or neck feel good for several hours after the injection, the pain will likely return.  It takes 3-7 days for steroids to work in the epidural space.  You may not notice any pain relief for at least that one week.    If effective, we will often do a series of three injections spaced 3-6 weeks apart to maximally decrease your pain.  After the initial series, we generally will wait several months before considering a repeat injection of the same type.  If you have any questions, please call [phone]  [facility] 

## 2015-07-01 NOTE — Progress Notes (Signed)
Safety precautions to be maintained throughout the outpatient stay will include: orient to surroundings, keep bed in low position, maintain call bell within reach at all times, provide assistance with transfer out of bed and ambulation.  

## 2015-07-02 ENCOUNTER — Emergency Department
Admission: EM | Admit: 2015-07-02 | Discharge: 2015-07-02 | Disposition: A | Discharge status: Home / Self Care | Payer: Medicaid Other | Attending: Emergency Medicine | Admitting: Emergency Medicine

## 2015-07-02 ENCOUNTER — Encounter: Payer: Self-pay | Admitting: Emergency Medicine

## 2015-07-02 DIAGNOSIS — Y9289 Other specified places as the place of occurrence of the external cause: Secondary | ICD-10-CM | POA: Diagnosis not present

## 2015-07-02 DIAGNOSIS — Y9389 Activity, other specified: Secondary | ICD-10-CM | POA: Diagnosis not present

## 2015-07-02 DIAGNOSIS — Z791 Long term (current) use of non-steroidal anti-inflammatories (NSAID): Secondary | ICD-10-CM | POA: Insufficient documentation

## 2015-07-02 DIAGNOSIS — Y998 Other external cause status: Secondary | ICD-10-CM | POA: Insufficient documentation

## 2015-07-02 DIAGNOSIS — R21 Rash and other nonspecific skin eruption: Secondary | ICD-10-CM | POA: Diagnosis present

## 2015-07-02 DIAGNOSIS — Z720 Tobacco use: Secondary | ICD-10-CM | POA: Diagnosis not present

## 2015-07-02 DIAGNOSIS — Z79899 Other long term (current) drug therapy: Secondary | ICD-10-CM | POA: Diagnosis not present

## 2015-07-02 DIAGNOSIS — T368X1A Poisoning by other systemic antibiotics, accidental (unintentional), initial encounter: Secondary | ICD-10-CM | POA: Diagnosis not present

## 2015-07-02 DIAGNOSIS — T50905A Adverse effect of unspecified drugs, medicaments and biological substances, initial encounter: Secondary | ICD-10-CM

## 2015-07-02 MED ORDER — HYDROXYZINE HCL 50 MG PO TABS: 50.0000 mg | ORAL_TABLET | Freq: Three times a day (TID) | ORAL | Status: AC | PRN

## 2015-07-02 MED ORDER — DIPHENHYDRAMINE HCL 50 MG/ML IJ SOLN
25.0000 mg | Freq: Once | INTRAMUSCULAR | Status: AC
  Administered 2015-07-02: 25 mg via INTRAVENOUS
  Filled 2015-07-02: qty 1

## 2015-07-02 MED ORDER — METHYLPREDNISOLONE 4 MG PO TBPK: ORAL_TABLET | ORAL | Status: AC

## 2015-07-02 MED ORDER — FAMOTIDINE IN NACL 20-0.9 MG/50ML-% IV SOLN
20.0000 mg | Freq: Once | INTRAVENOUS | Status: AC
  Administered 2015-07-02: 20 mg via INTRAVENOUS
  Filled 2015-07-02: qty 50

## 2015-07-02 MED ORDER — METHYLPREDNISOLONE SODIUM SUCC 125 MG IJ SOLR
125.0000 mg | Freq: Once | INTRAMUSCULAR | Status: AC
  Administered 2015-07-02: 125 mg via INTRAVENOUS
  Filled 2015-07-02: qty 2

## 2015-07-02 MED ORDER — SODIUM CHLORIDE 0.9 % IV BOLUS (SEPSIS)
1000.0000 mL | Freq: Once | INTRAVENOUS | Status: AC
  Administered 2015-07-02: 1000 mL via INTRAVENOUS
  Filled 2015-07-02: qty 1000

## 2015-07-02 NOTE — ED Provider Notes (Signed)
[facility] Emergency Department Provider Note ____________________________________________  Time seen: Approximately 4:25 PM  I have reviewed the triage vital signs and the nursing notes.   HISTORY  Chief Complaint Urticaria    HPI Leslie Rojas is a 29 y.o. female patient complaining of red rash and itching for 3 days. Patient states the rashes all of her body onset status post taken spouce's  clindamycin.Patient noticed a rash after the fourth dosage. Patient denies any anaphylactic signs or symptoms. Patient is rating her pain /discomfort as a 10 over 10. Patient states mild relief with Benadryl.  Past Medical History  Diagnosis Date  . Sciatic pain   . Back pain   . Asthma     Patient Active Problem List   Diagnosis Date Noted  . Midline low back pain with right-sided sciatica 07/01/2015  . DDD (degenerative disc disease), lumbosacral 07/01/2015  . Lumbar radiculopathy 07/01/2015  . Depression 07/01/2015  . Asthma, chronic 07/01/2015  . Iron deficiency anemia 07/01/2015  . Fibromyalgia 07/01/2015  . H/O cesarean section 01/17/2015  . Leg numbness 01/17/2015  . Maternal obesity syndrome 01/17/2015  . Dysmenorrhea 01/17/2015  . Bleeding postpartum 01/17/2015    Past Surgical History  Procedure Laterality Date  . Tubal ligation    . Cesarean section      Current Outpatient Rx  Name  Route  Sig  Dispense  Refill  . albuterol (PROVENTIL HFA;VENTOLIN HFA) 108 (90 BASE) MCG/ACT inhaler   Inhalation   Inhale 1-2 puffs into the lungs every 6 (six) hours as needed for wheezing or shortness of breath.   1 Inhaler   0   . docusate sodium (COLACE) 100 MG capsule   Oral   Take 1 capsule (100 mg total) by mouth 2 (two) times daily. Patient not taking: Reported on 07/01/2015   60 capsule   6   . DULoxetine (CYMBALTA) 30 MG capsule   Oral   Take 30 mg by mouth 2 (two) times daily.         . hydrOXYzine (ATARAX/VISTARIL) 50 MG  tablet   Oral   Take 1 tablet (50 mg total) by mouth 3 (three) times daily as needed for itching.   15 tablet   0   . meloxicam (MOBIC) 7.5 MG tablet   Oral   Take 1 tablet (7.5 mg total) by mouth 2 (two) times daily after a meal.   28 tablet   3   . methylPREDNISolone (MEDROL DOSEPAK) 4 MG TBPK tablet      Take Tapered dose as directed   21 tablet   0   . oxyCODONE-acetaminophen (PERCOCET) 7.5-325 MG per tablet   Oral   Take 1 tablet by mouth every 6 (six) hours as needed for moderate pain or severe pain (May take up to 2 tablets as needed for pain.).   112 tablet   0     Allergies Ceclor and Ritalin  Family History  Problem Relation Age of Onset  . Diabetes Mother   . Asthma Mother   . Thyroid disease Mother   . Heart disease Father     Social History Social History  Substance Use Topics  . Smoking status: Current Every Day Smoker -- 0.25 packs/day    Types: Cigarettes  . Smokeless tobacco: None  . Alcohol Use: No    Review of Systems Constitutional: No fever/chills Eyes: No visual changes. ENT: No sore throat. Cardiovascular: Denies chest pain. Respiratory: Denies shortness of breath. Gastrointestinal: No  abdominal pain.  No nausea, no vomiting.  No diarrhea.  No constipation. Genitourinary: Negative for dysuria. Musculoskeletal: Negative for back pain. Skin: Negative for rash. Neurological: Negative for headaches, focal weakness or numbness. Allergic/Immunilogical: see medication list. 10-point ROS otherwise negative.  ____________________________________________   PHYSICAL EXAM:  VITAL SIGNS: ED Triage Vitals  Enc Vitals Group     BP 07/02/15 1608 100/54 mmHg     Pulse Rate 07/02/15 1608 90     Resp 07/02/15 1608 18     Temp 07/02/15 1608 97.9 F (36.6 C)     Temp Source 07/02/15 1608 Oral     SpO2 07/02/15 1608 100 %     Weight 07/02/15 1608 160 lb (72.576 kg)     Height 07/02/15 1608 4\' 11"  (1.499 m)     Head Cir --      Peak Flow  --      Pain Score 07/02/15 1609 10     Pain Loc --      Pain Edu? --      Excl. in GC? --     Constitutional: Alert and oriented. Well appearing and in no acute distress. Eyes: Conjunctivae are normal. PERRL. EOMI. Head: Atraumatic. Nose: No congestion/rhinnorhea. Mouth/Throat: Mucous membranes are moist.  Oropharynx non-erythematous. Neck: No stridor.   Hematological/Lymphatic/Immunilogical: No cervical lymphadenopathy. Cardiovascular: Normal rate, regular rhythm. Grossly normal heart sounds.  Good peripheral circulation. Respiratory: Normal respiratory effort.  No retractions. Lungs CTAB. Gastrointestinal: Soft and nontender. No distention. No abdominal bruits. No CVA tenderness. Musculoskeletal: No lower extremity tenderness nor edema.  No joint effusions. Neurologic:  Normal speech and language. No gross focal neurologic deficits are appreciated. No gait instability. Skin:  Skin is warm, dry and intact. Diffused macular lesions Psychiatric: Mood and affect are normal. Speech and behavior are normal.  ____________________________________________   LABS (all labs ordered are listed, but only abnormal results are displayed)  Labs Reviewed - No data to display ____________________________________________  EKG   ____________________________________________  RADIOLOGY   ____________________________________________   PROCEDURES  Procedure(s) performed: None  Critical Care performed: No  ____________________________________________   INITIAL IMPRESSION / ASSESSMENT AND PLAN / ED COURSE  Pertinent labs & imaging results that were available during my care of the patient were reviewed by me and considered in my medical decision making (see chart for details).  Allergic reaction to medication. 1812 hrs. patient stated much improvement. Decreased itching and macular lesions are fading. She was discharged with prescription for prednisone and Atarax. Advised patient [facility]  [facility]. ____________________________________________   FINAL CLINICAL IMPRESSION(S) / ED DIAGNOSES  Final diagnoses:  Medication reaction, initial encounter      Joni Reining, PA-C 07/02/15 1817  Arnaldo Natal, MD 07/03/15 0010

## 2015-07-02 NOTE — ED Notes (Signed)
Red rash, itching for 3 days all over body. Denies source.

## 2015-07-02 NOTE — ED Notes (Signed)
Pt states she had a cold and took husbands clindamycin stopped about 4 days ago took roughly 4 pills and now has head to toe rash and blisters to hands and feet.

## 2015-07-03 ENCOUNTER — Encounter: Payer: Self-pay | Admitting: Anesthesiology

## 2015-07-03 ENCOUNTER — Ambulatory Visit: Payer: Medicaid Other | Attending: Anesthesiology | Admitting: Anesthesiology

## 2015-07-03 VITALS — BP 104/48 | HR 90 | Temp 98.5°F | Resp 16 | Ht 59.0 in | Wt 160.0 lb

## 2015-07-03 DIAGNOSIS — M5416 Radiculopathy, lumbar region: Secondary | ICD-10-CM

## 2015-07-03 DIAGNOSIS — M545 Low back pain, unspecified: Secondary | ICD-10-CM

## 2015-07-03 DIAGNOSIS — M5116 Intervertebral disc disorders with radiculopathy, lumbar region: Secondary | ICD-10-CM | POA: Diagnosis not present

## 2015-07-03 DIAGNOSIS — M5137 Other intervertebral disc degeneration, lumbosacral region: Secondary | ICD-10-CM

## 2015-07-03 DIAGNOSIS — G8929 Other chronic pain: Secondary | ICD-10-CM | POA: Insufficient documentation

## 2015-07-03 NOTE — Patient Instructions (Signed)
Pain Management Discharge Instructions  General Discharge Instructions :  If you need to reach your doctor call: Monday-Friday 8:00 am - 4:00 pm at [phone] or toll free [phone].  After clinic hours [phone] to have operator reach doctor.  Bring all of your medication bottles to all your appointments in the pain clinic.  To cancel or reschedule your appointment with Pain Management please remember to call 24 hours in advance to avoid a fee.  Refer to the educational materials which you have been given on: General Risks, I had my Procedure. Discharge Instructions, Post Sedation.  Post Procedure Instructions:  The drugs you were given will stay in your system until tomorrow, so for the next 24 hours you should not drive, make any legal decisions or drink any alcoholic beverages.  You may eat anything you prefer, but it is better to start with liquids then soups and crackers, and gradually work up to solid foods.  Please notify your doctor immediately if you have any unusual bleeding, trouble breathing or pain that is not related to your normal pain.  Depending on the type of procedure that was done, some parts of your body may feel week and/or numb.  This usually clears up by tonight or the next day.  Walk with the use of an assistive device or accompanied by an adult for the 24 hours.  You may use ice on the affected area for the first 24 hours.  Put ice in a Ziploc bag and cover with a towel and place against area 15 minutes on 15 minutes off.  You may switch to heat after 24 hours.Lumbar Sympathetic Block Patient Information  Description: The lumbar plexus is a group of nerves that are part of the sympathetic nervous system.  These nerves supply organs in the pelvis and legs.  Lumbar sympathetic blocks are utilized for the diagnosis and treatment of painful conditions in these areas.   The lumbar plexus is located on both sides of the aorta at approximately the level of  the second lumbar vertebral body.  The block will be performed with you lying on your abdomen with a pillow underneath.  Using direct x-ray guidance,   The plexus will be located on both sides of the spine.  Numbing medicine will be used to deaden the skin prior to needle insertion.  In most cases, a small amount of sedation can be give by IV prior to the numbing medicine.  One or two small needles will be placed near the plexus and local anesthetic will be injected.  This may make your leg(s) feel warm.  The Entire block usually lasts about 15-25 minutes.  Conditions which may be treated by lumbar sympathetic block:   Reflex sympathetic dystrophy  Phantom limb pain  Peripheral neuropathy  Peripheral vascular disease ( inadequate blood flow )  Cancer pain of pelvis, leg and kidney  Preparation for the injection:  1. Do note eat any solid food or diary products within 6 hours of your appointment. 2. You may drink clear liquids up to 2 hours before appointment.  Clear liquids include water, black coffee, juice or soda.  No milk or cream please. 3. You may take your regular medication, including pain medications, with a sip of water before you appointment.  Diabetics should hold regular insulin ( if taken separately ) and take 1/2 NPH dose the morning of the procedure .  Carry some sugar containing items with you to your appointment. 4. A driver must accompany you   and be prepared to drive you home after your procedure. 5. Bring all your current medication with you. 6. An IV may be inserted and sedation may be given at the discretion of the physician.  7. A blood pressure cuff, EKG and other monitors will often be applied during the procedure.  Some patients may need to have extra oxygen administered for a short period. 8. You will be asked to provide medical information, including your allergies and medications, prior to the procedure.  We must know immediately if your taking blood thinners  (like Coumadin/Warfarin) or if you are allergic to IV iodine contrast (dye).  We must know if you could possibly be pregnant.  Possible side-effects   Bleeding from needle site or deeper  Infection (rare, can require surgery)  Nerve injury (rare)  Numbness & tingling (temporary)  Collapsed lung (rare)  Spinal headache (a headache worse with upright posture)  Light-headedness (temporary)  Pain at injection site (several days)  Decreased blood pressure (temporary)  Weakness in legs (temporary)  Seizure or other drug reaction (rare)  Call if you experience:   Fever/chills associated with headache or increased back/ neck pain  Headache worsened by an upright position  New onset weakness or numbness of an extremity below the injection site  Hives or difficulty breathing ( go to the emergency room)  Inflammation or drainage at the injections site(s)  New symptoms which are concerning to you  Please note:  If effective, we will often do a series of 2-3 injections spaced 3-6 weeks apart to maximally decrease your pain.  If initial series is effective, you may be a candidate for a more permanent block of the lumbar sympathetic plexus.  If you have any questions please call [phone] [facility]  

## 2015-07-03 NOTE — Procedures (Signed)
Date of procedure: 07/03/2015  Preoperative Diagnosis:  1 chronic low back pain 2 lumbar degenerative disc disease 3 lumbar radiculopathy  Postoperative Diagnosis:  Same.  Procedure: 1. Caudal epidural steroid injection, 2. Epidural with interpretation. 3. Fluoroscopic guidance.  Surgeon: Tod Persia, MD  Anesthesia: MAC anesthesia by by the nursing staff under my direction  Informed consent was obtained and the patient appeared to accept and understand the benefits and risks of this procedure.   Pre procedure comments:    Description of the Procedure:  The patient was taken to the operating room and placed in the prone position.  Intravenous sedation and MAC anesthesia was administered by the nurse and staff under my direction. After appropriate sedation, the sacrococcygeal area was prepped with Betadine.  After adequate draping, the area between the sacral cornu was palpated and infiltrated with 3 cc of 1% Lidocaine.   An AP fluoroscopic view of the sacrum was visualized and a 17 gauge Tuohy needle was inserted in the midline at the angle of 45 degrees through the sacrococcygeal membrane.  After making contact with the bone, the needle was withdrawn and readvanced in horizontal position, into the caudal epidural space.  Epidurogram Study:  One cc of Omnipaque 300 was injected through the needle and epidurogram was visualized in both the later and AP views.  Comments:   Epidurogram the feet showed that the contrast media's spread catheter lab and as high as L3 (comments regarding whether a catheter was used or not) And the distribution of the dye was seen to be on both the with the spread being more dominant on the left side right and the left side Caudal Epidural Steroid Injection:  Then 10 cc of 0.25% Bupivacaine and 12 mg of Kenalog were injected into the Caudal epidural space.  The needle was removed and adequate hemostasis was established.    The patient tolerated  the procedure quite well and vital signs were stable.  There were no adverse effects.  Additional comments:    The patient was taken to the recovery room in satisfactory condition where the patient was observed and subsequently discharged home.    Will follow up in the clinic in the next week.  Tod Persia M.D.

## 2015-07-03 NOTE — Progress Notes (Signed)
Safety precautions to be maintained throughout the outpatient stay will include: orient to surroundings, keep bed in low position, maintain call bell within reach at all times, provide assistance with transfer out of bed and ambulation.  

## 2015-07-06 NOTE — Telephone Encounter (Signed)
Patient not having any complications from procedure.  Desires to discuss alternate pain relief options with MD at appointment on Wednesday.

## 2015-07-07 ENCOUNTER — Other Ambulatory Visit: Payer: Self-pay | Admitting: Anesthesiology

## 2015-07-08 ENCOUNTER — Ambulatory Visit: Payer: Medicaid Other | Admitting: Anesthesiology

## 2015-07-08 ENCOUNTER — Encounter: Payer: Self-pay | Admitting: Anesthesiology

## 2015-07-08 VITALS — BP 113/73 | HR 72 | Temp 98.8°F | Resp 18 | Ht 59.0 in | Wt 160.0 lb

## 2015-07-08 DIAGNOSIS — M5137 Other intervertebral disc degeneration, lumbosacral region: Secondary | ICD-10-CM | POA: Insufficient documentation

## 2015-07-08 DIAGNOSIS — M5416 Radiculopathy, lumbar region: Secondary | ICD-10-CM | POA: Diagnosis not present

## 2015-07-08 DIAGNOSIS — M5441 Lumbago with sciatica, right side: Secondary | ICD-10-CM | POA: Insufficient documentation

## 2015-07-08 NOTE — Progress Notes (Signed)
Safety precautions to be maintained throughout the outpatient stay will include: orient to surroundings, keep bed in low position, maintain call bell within reach at all times, provide assistance with transfer out of bed and ambulation.  

## 2015-07-08 NOTE — Patient Instructions (Signed)
Smoking Cessation Quitting smoking is important to your health and has many advantages. However, it is not always easy to quit since nicotine is a very addictive drug. Oftentimes, people try 3 times or more before being able to quit. This document explains the best ways for you to prepare to quit smoking. Quitting takes hard work and a lot of effort, but you can do it. ADVANTAGES OF QUITTING SMOKING  You will live longer, feel better, and live better.  Your body will feel the impact of quitting smoking almost immediately.  Within 20 minutes, blood pressure decreases. Your pulse returns to its normal level.  After 8 hours, carbon monoxide levels in the blood return to normal. Your oxygen level increases.  After 24 hours, the chance of having a heart attack starts to decrease. Your breath, hair, and body stop smelling like smoke.  After 48 hours, damaged nerve endings begin to recover. Your sense of taste and smell improve.  After 72 hours, the body is virtually free of nicotine. Your bronchial tubes relax and breathing becomes easier.  After 2 to 12 weeks, lungs can hold more air. Exercise becomes easier and circulation improves.  The risk of having a heart attack, stroke, cancer, or lung disease is greatly reduced.  After 1 year, the risk of coronary heart disease is cut in half.  After 5 years, the risk of stroke falls to the same as a nonsmoker.  After 10 years, the risk of lung cancer is cut in half and the risk of other cancers decreases significantly.  After 15 years, the risk of coronary heart disease drops, usually to the level of a nonsmoker.  If you are pregnant, quitting smoking will improve your chances of having a healthy baby.  The people you live with, especially any children, will be healthier.  You will have extra money to spend on things other than cigarettes. QUESTIONS TO THINK ABOUT BEFORE ATTEMPTING TO QUIT You may want to talk about your answers with your  health care provider.  Why do you want to quit?  If you tried to quit in the past, what helped and what did not?  What will be the most difficult situations for you after you quit? How will you plan to handle them?  Who can help you through the tough times? Your family? Friends? A health care provider?  What pleasures do you get from smoking? What ways can you still get pleasure if you quit? Here are some questions to ask your health care provider:  How can you help me to be successful at quitting?  What medicine do you think would be best for me and how should I take it?  What should I do if I need more help?  What is smoking withdrawal like? How can I get information on withdrawal? GET READY  Set a quit date.  Change your environment by getting rid of all cigarettes, ashtrays, matches, and lighters in your home, car, or work. Do not let people smoke in your home.  Review your past attempts to quit. Think about what worked and what did not. GET SUPPORT AND ENCOURAGEMENT You have a better chance of being successful if you have help. You can get support in many ways.  Tell your family, friends, and coworkers that you are going to quit and need their support. Ask them not to smoke around you.  Get individual, group, or telephone counseling and support. Programs are available at local hospitals and health centers. Call   your local health department for information about programs in your area.  Spiritual beliefs and practices may help some smokers quit.  Download a "quit meter" on your computer to keep track of quit statistics, such as how long you have gone without smoking, cigarettes not smoked, and money saved.  Get a self-help book about quitting smoking and staying off tobacco. LEARN NEW SKILLS AND BEHAVIORS  Distract yourself from urges to smoke. Talk to someone, go for a walk, or occupy your time with a task.  Change your normal routine. Take a different route to work.  Drink tea instead of coffee. Eat breakfast in a different place.  Reduce your stress. Take a hot bath, exercise, or read a book.  Plan something enjoyable to do every day. Reward yourself for not smoking.  Explore interactive web-based programs that specialize in helping you quit. GET MEDICINE AND USE IT CORRECTLY Medicines can help you stop smoking and decrease the urge to smoke. Combining medicine with the above behavioral methods and support can greatly increase your chances of successfully quitting smoking.  Nicotine replacement therapy helps deliver nicotine to your body without the negative effects and risks of smoking. Nicotine replacement therapy includes nicotine gum, lozenges, inhalers, nasal sprays, and skin patches. Some may be available over-the-counter and others require a prescription.  Antidepressant medicine helps people abstain from smoking, but how this works is unknown. This medicine is available by prescription.  Nicotinic receptor partial agonist medicine simulates the effect of nicotine in your brain. This medicine is available by prescription. Ask your health care provider for advice about which medicines to use and how to use them based on your health history. Your health care provider will tell you what side effects to look out for if you choose to be on a medicine or therapy. Carefully read the information on the package. Do not use any other product containing nicotine while using a nicotine replacement product.  RELAPSE OR DIFFICULT SITUATIONS Most relapses occur within the first 3 months after quitting. Do not be discouraged if you start smoking again. Remember, most people try several times before finally quitting. You may have symptoms of withdrawal because your body is used to nicotine. You may crave cigarettes, be irritable, feel very hungry, cough often, get headaches, or have difficulty concentrating. The withdrawal symptoms are only temporary. They are strongest  when you first quit, but they will go away within 10-14 days. To reduce the chances of relapse, try to:  Avoid drinking alcohol. Drinking lowers your chances of successfully quitting.  Reduce the amount of caffeine you consume. Once you quit smoking, the amount of caffeine in your body increases and can give you symptoms, such as a rapid heartbeat, sweating, and anxiety.  Avoid smokers because they can make you want to smoke.  Do not let weight gain distract you. Many smokers will gain weight when they quit, usually less than 10 pounds. Eat a healthy diet and stay active. You can always lose the weight gained after you quit.  Find ways to improve your mood other than smoking. FOR MORE INFORMATION  www.smokefree.gov  Document Released: 09/27/2001 Document Revised: 02/17/2014 Document Reviewed: 01/12/2012 [employer] Patient Information 2015 [employer]. This information is not intended to replace advice given to you by your health care provider. Make sure you discuss any questions you have with your health care provider.  

## 2015-07-10 ENCOUNTER — Encounter: Payer: Self-pay | Admitting: Anesthesiology

## 2015-07-10 ENCOUNTER — Ambulatory Visit: Payer: Medicaid Other | Attending: Anesthesiology | Admitting: Anesthesiology

## 2015-07-10 VITALS — BP 111/53 | HR 64 | Temp 98.6°F | Resp 16 | Ht 59.0 in | Wt 160.0 lb

## 2015-07-10 DIAGNOSIS — M797 Fibromyalgia: Secondary | ICD-10-CM

## 2015-07-10 DIAGNOSIS — M545 Low back pain: Secondary | ICD-10-CM | POA: Insufficient documentation

## 2015-07-10 DIAGNOSIS — M5137 Other intervertebral disc degeneration, lumbosacral region: Secondary | ICD-10-CM

## 2015-07-10 DIAGNOSIS — M5116 Intervertebral disc disorders with radiculopathy, lumbar region: Secondary | ICD-10-CM | POA: Insufficient documentation

## 2015-07-10 DIAGNOSIS — R200 Anesthesia of skin: Secondary | ICD-10-CM

## 2015-07-10 DIAGNOSIS — M5441 Lumbago with sciatica, right side: Secondary | ICD-10-CM

## 2015-07-10 DIAGNOSIS — G8929 Other chronic pain: Secondary | ICD-10-CM | POA: Insufficient documentation

## 2015-07-10 DIAGNOSIS — Z7289 Other problems related to lifestyle: Secondary | ICD-10-CM | POA: Diagnosis not present

## 2015-07-10 MED ORDER — BUPRENORPHINE 5 MCG/HR TD PTWK: 5.0000 ug | MEDICATED_PATCH | TRANSDERMAL | Status: AC

## 2015-07-10 NOTE — Progress Notes (Signed)
Safety precautions to be maintained throughout the outpatient stay will include: orient to surroundings, keep bed in low position, maintain call bell within reach at all times, provide assistance with transfer out of bed and ambulation.  

## 2015-07-10 NOTE — Progress Notes (Signed)
Subjective:    Patient ID: Leslie Rojas, female    DOB: December 31, 1985, 29 y.o.   MRN: [id number] This patient came back to the clinic today after creating a little bit of a disruption on Wednesday Her appointment was scheduled for 2 PM and she walked into the clinic at 9.45 am and demanded to be seen There were at least 5 patient's had of her and I told her she would have to wait to After being very rude and disagreeable she left and rescheduled for me to see her today Her behavior was a little more subdued and respectful today She indicated that she got absolutely no relief from the caudal epidural steroid injection I performed for her and she indicated that she did not want any further procedures. She indicated that she had difficulty ambulating and insisted that I prescribed back brace all cane for her I told that this was the per view of her primary care physician and I directed her to her primary care She indicated that her subjective pain intensity rating was 90% She showed significant pain behavior and pain dramatization and also drug seeking behavior HPI    Review of Systems  Constitutional: Negative.   HENT: Negative.   Eyes: Negative.   Respiratory: Negative.   Cardiovascular: Negative.   Gastrointestinal: Negative.   Endocrine: Negative.   Genitourinary: Negative.   Musculoskeletal: Negative.   Skin: Negative.   Allergic/Immunologic: Negative.   Neurological: Negative.   Hematological: Negative.   Psychiatric/Behavioral: Negative.        Patient showed significant drug-seeking behavior and pain dramatization While she was not disruptive today on Wednesday she was very disruptive in the clinic demanded to be seen out of turn       Objective:   Physical Exam  Constitutional: She is oriented to person, place, and time. She appears well-developed and well-nourished.  HENT:  Head: Normocephalic and atraumatic.  Eyes: EOM are normal. Pupils are equal, round, and  reactive to light. Left eye exhibits no discharge. No scleral icterus.  Neck: Normal range of motion. Neck supple. No JVD present. No tracheal deviation present. No thyromegaly present.  Cardiovascular: Normal rate, regular rhythm, normal heart sounds and intact distal pulses.  Exam reveals no gallop and no friction rub.   No murmur heard. Blood pressure was 111/53 mmHg Pulse was 64 bpm Equal and regular Heart sounds 1 and 2 were heard in all areas There were no audible murmurs  Pulmonary/Chest: Effort normal and breath sounds normal. No respiratory distress. She has no wheezes. She has no rales. She exhibits no tenderness.  Abdominal: Soft. Bowel sounds are normal. She exhibits no distension and no mass. There is no tenderness. There is no rebound and no guarding.  Genitourinary:  Genitourinary examination was deferred  Musculoskeletal: Normal range of motion. She exhibits no edema or tenderness.  Lymphadenopathy:    She has no cervical adenopathy.  Neurological: She is alert and oriented to person, place, and time. She has normal reflexes. She displays normal reflexes. No cranial nerve deficit. She exhibits normal muscle tone. Coordination normal.  Skin: Skin is warm and dry. No rash noted. No erythema.          Assessment & Plan:    Assessment 1 chronic low back pain 2 lumbar degenerative disc disease 3 lumbar radiculopathy 4 drug-seeking behavior  Plan of manner We'll plan to give the patient Butrans patch 5 g to be taken every 7 days and will give her 4  patches I spent quite a bit of time approximately 20 minutes trying to dissuade her from pursuing opioids and to maintain and a positive attitude. However I do not think I succeeded. We'll follow-up with her in 2 weeks   Established patient   level III   Tod Persia M.D.

## 2015-07-22 ENCOUNTER — Ambulatory Visit: Payer: Medicaid Other | Admitting: Anesthesiology

## 2015-08-06 ENCOUNTER — Ambulatory Visit: Payer: Medicaid Other | Admitting: Neurology

## 2015-09-30 ENCOUNTER — Encounter: Payer: Self-pay | Admitting: Obstetrics and Gynecology

## 2015-09-30 ENCOUNTER — Telehealth: Payer: Self-pay | Admitting: Obstetrics and Gynecology

## 2015-09-30 NOTE — Telephone Encounter (Signed)
PT CALLED STATING THAT SHE HAS MOVED TO [place] AND SHE NEEDS A NOTE FROM YOU LISTING ALL HER PROBLEMS THAT SHE HAD DURING HER PREGNANCY TO WERE SHE WAS DISABLED, SHE IS APPLYING FOR DISABILITY IN [place] AND THEY TOLD HER SHE NEEDS A NOTE FROM HER DOCTOR STATING THIS.

## 2015-10-20 ENCOUNTER — Telehealth: Payer: Self-pay | Admitting: Obstetrics and Gynecology

## 2015-10-20 NOTE — Telephone Encounter (Signed)
PT CALLED AND WANTED A CALL BACK FROM YOU OR DR CHERRY, SHE LM ON OFFICE VM YESTERDAY 10/19/15 STATED SHE NEEDED TO TALK TO YOU OR DR CHERRY.

## 2015-10-21 NOTE — Telephone Encounter (Signed)
Spoke with pt she states that she had a form of medical examination sent to us that she needs filled out. Advised pt that we have a 10day turn around policy but that I would get her paperwork done as soon as possible.

## 2015-10-27 ENCOUNTER — Encounter: Payer: Self-pay | Admitting: Anesthesiology

## 2015-10-29 ENCOUNTER — Telehealth: Payer: Self-pay | Admitting: Obstetrics and Gynecology

## 2015-10-29 NOTE — Telephone Encounter (Signed)
Pt called and would like a call back from you as soon as possible

## 2015-10-30 NOTE — Telephone Encounter (Signed)
Called pt she states that she was told by the disability office that she could work and wants to do if we told the office this. Advised pt that we received paperwork that asked us if the condition we treated her for during pregnancy was debilitating to the point of not being able to work. Advised pt that per Dr.Cherry the conditions treated during pregnancy (mainly sciatic nerve pain) was not a reason to be disabled from work. Strongly recommended that pt find a provider in her state ([place]

## 2015-11-04 ENCOUNTER — Telehealth: Payer: Self-pay | Admitting: Anesthesiology

## 2015-11-04 NOTE — Telephone Encounter (Signed)
Explained to patient that Dr. Starling Manns cannot write a letter stating she is unable to work, because he has not seen her since 06-2015, and that cannot be determined without recent exam and FCE. Patient asked for her records, she was given the number to the medical records dept.

## 2015-11-04 NOTE — Telephone Encounter (Signed)
Patient states she needs a letter stating she is unable to work and about her medical pain issues / please call to discuss / [place] lives out of state now / wants the letter from Dr. Starling Manns / explained Dr. Starling Manns will be out of office until Feb.

## 2016-05-26 ENCOUNTER — Encounter: Payer: Medicaid Other | Admitting: Obstetrics and Gynecology

## 2018-12-04 ENCOUNTER — Ambulatory Visit: Payer: Medicaid Other | Admitting: Family Medicine

## 2018-12-21 ENCOUNTER — Encounter: Payer: Medicaid Other | Admitting: Certified Nurse Midwife

## 2018-12-21 ENCOUNTER — Encounter: Payer: Medicaid Other | Admitting: Obstetrics and Gynecology

## 2021-02-23 ENCOUNTER — Emergency Department
Admission: EM | Admit: 2021-02-23 | Discharge: 2021-02-23 | Disposition: A | Discharge status: Home / Self Care | Payer: Medicaid Other | Attending: Emergency Medicine | Admitting: Emergency Medicine

## 2021-02-23 ENCOUNTER — Other Ambulatory Visit: Payer: Self-pay

## 2021-02-23 ENCOUNTER — Emergency Department: Payer: Medicaid Other

## 2021-02-23 DIAGNOSIS — R0602 Shortness of breath: Secondary | ICD-10-CM | POA: Diagnosis present

## 2021-02-23 DIAGNOSIS — Z20822 Contact with and (suspected) exposure to covid-19: Secondary | ICD-10-CM | POA: Diagnosis not present

## 2021-02-23 DIAGNOSIS — J45901 Unspecified asthma with (acute) exacerbation: Secondary | ICD-10-CM | POA: Insufficient documentation

## 2021-02-23 DIAGNOSIS — F17210 Nicotine dependence, cigarettes, uncomplicated: Secondary | ICD-10-CM | POA: Diagnosis not present

## 2021-02-23 LAB — CBC WITH DIFFERENTIAL/PLATELET
Abs Immature Granulocytes: 0.07 10*3/uL (ref 0.00–0.07)
Basophils Absolute: 0.1 10*3/uL (ref 0.0–0.1)
Basophils Relative: 0 %
Eosinophils Absolute: 0.2 10*3/uL (ref 0.0–0.5)
Eosinophils Relative: 1 %
HCT: 36.1 % (ref 36.0–46.0)
Hemoglobin: 12 g/dL (ref 12.0–15.0)
Immature Granulocytes: 1 %
Lymphocytes Relative: 35 %
Lymphs Abs: 5.1 10*3/uL — ABNORMAL HIGH (ref 0.7–4.0)
MCH: 30.3 pg (ref 26.0–34.0)
MCHC: 33.2 g/dL (ref 30.0–36.0)
MCV: 91.2 fL (ref 80.0–100.0)
Monocytes Absolute: 1 10*3/uL (ref 0.1–1.0)
Monocytes Relative: 7 %
Neutro Abs: 8.3 10*3/uL — ABNORMAL HIGH (ref 1.7–7.7)
Neutrophils Relative %: 56 %
Platelets: 273 10*3/uL (ref 150–400)
RBC: 3.96 MIL/uL (ref 3.87–5.11)
RDW: 12 % (ref 11.5–15.5)
WBC: 14.7 10*3/uL — ABNORMAL HIGH (ref 4.0–10.5)
nRBC: 0 % (ref 0.0–0.2)

## 2021-02-23 LAB — BASIC METABOLIC PANEL
Anion gap: 6 (ref 5–15)
BUN: 8 mg/dL (ref 6–20)
CO2: 25 mmol/L (ref 22–32)
Calcium: 9 mg/dL (ref 8.9–10.3)
Chloride: 105 mmol/L (ref 98–111)
Creatinine, Ser: 0.63 mg/dL (ref 0.44–1.00)
GFR, Estimated: >60 mL/min (ref >60)
Glucose, Bld: 90 mg/dL (ref 70–99)
Potassium: 3.8 mmol/L (ref 3.5–5.1)
Sodium: 136 mmol/L (ref 135–145)

## 2021-02-23 LAB — RESP PANEL BY RT-PCR (FLU A&B, COVID) ARPGX2
Influenza A by PCR: NEGATIVE
Influenza B by PCR: NEGATIVE
SARS Coronavirus 2 by RT PCR: NEGATIVE

## 2021-02-23 LAB — POC URINE PREG, ED: Preg Test, Ur: NEGATIVE

## 2021-02-23 MED ORDER — ALBUTEROL SULFATE HFA 108 (90 BASE) MCG/ACT IN AERS: 2.0000 | INHALATION_SPRAY | Freq: Four times a day (QID) | RESPIRATORY_TRACT | 2 refills | Status: AC | PRN

## 2021-02-23 MED ORDER — IPRATROPIUM-ALBUTEROL 0.5-2.5 (3) MG/3ML IN SOLN
3.0000 mL | Freq: Once | RESPIRATORY_TRACT | Status: AC
  Administered 2021-02-23: 3 mL via RESPIRATORY_TRACT
  Filled 2021-02-23: qty 3

## 2021-02-23 MED ORDER — ALBUTEROL SULFATE (2.5 MG/3ML) 0.083% IN NEBU: 2.5000 mg | INHALATION_SOLUTION | RESPIRATORY_TRACT | 2 refills | Status: AC | PRN

## 2021-02-23 MED ORDER — IBUPROFEN 600 MG PO TABS: 600.0000 mg | ORAL_TABLET | Freq: Three times a day (TID) | ORAL | 0 refills | Status: DC | PRN

## 2021-02-23 MED ORDER — IBUPROFEN 600 MG PO TABS
600.0000 mg | ORAL_TABLET | Freq: Once | ORAL | Status: AC
  Administered 2021-02-23: 600 mg via ORAL
  Filled 2021-02-23: qty 1

## 2021-02-23 MED ORDER — LEVOFLOXACIN 750 MG PO TABS: 750.0000 mg | ORAL_TABLET | Freq: Every day | ORAL | 0 refills | Status: AC

## 2021-02-23 MED ORDER — LIDOCAINE VISCOUS HCL 2 % MT SOLN
15.0000 mL | Freq: Once | OROMUCOSAL | Status: AC
  Administered 2021-02-23: 15 mL via OROMUCOSAL
  Filled 2021-02-23: qty 15

## 2021-02-23 MED ORDER — PREDNISONE 20 MG PO TABS
60.0000 mg | ORAL_TABLET | Freq: Once | ORAL | Status: AC
  Administered 2021-02-23: 60 mg via ORAL
  Filled 2021-02-23: qty 3

## 2021-02-23 MED ORDER — PREDNISONE 10 MG (21) PO TBPK: ORAL_TABLET | ORAL | 0 refills | Status: AC

## 2021-02-23 NOTE — ED Triage Notes (Signed)
Pt states for the past 3 days she has had cough, congestion and shortness of breath. Pt denies fevers at home. Pt states the shortness of breath is so bad that she has been unable to eat.

## 2021-02-23 NOTE — ED Notes (Signed)
Imaging at bedside.

## 2021-02-23 NOTE — ED Notes (Signed)
Patient reports a short reduction of tooth pain after the Lidocaine. Patient reports a decrease in the coughing and increased ease of breathing, but states chest still feels tight. Dr. Derrill Kay informed.

## 2021-02-23 NOTE — Discharge Instructions (Signed)
Please seek medical attention for any high fevers, chest pain, shortness of breath, change in behavior, persistent vomiting, bloody stool or any other new or concerning symptoms.  

## 2021-02-23 NOTE — ED Notes (Signed)
Patient given Ed sandwich tray per request.Patient was able to finish the tray.

## 2021-02-23 NOTE — ED Provider Notes (Signed)
[facility] Emergency Department Provider Note  ____________________________________________   I have reviewed the triage vital signs and the nursing notes.   HISTORY  Chief Complaint Shortness of Breath   History limited by: Not Limited   HPI Leslie Rojas is a 35 y.o. female who presents to the emergency department today because of concern for shortness of breath. Her symptoms have been present over the past three days. Her shortness of breath is associated both with chest tightness and cough. The chest tightness is located across the center part of her chest. Her cough initially was productive but is now dry. She does have a history of a asthma and tried her son's inhaler with minimal relief. She denies any recent travel. Denies extremity swelling. The patient denies any recent sick contacts or fevers.    Records reviewed. Per medical record review patient has a history of asthma.   Past Medical History:  Diagnosis Date  . Asthma   . Back pain   . Sciatic pain     Patient Active Problem List   Diagnosis Date Noted  . Midline low back pain with right-sided sciatica 07/01/2015  . DDD (degenerative disc disease), lumbosacral 07/01/2015  . Lumbar radiculopathy 07/01/2015  . Depression 07/01/2015  . Asthma, chronic 07/01/2015  . Iron deficiency anemia 07/01/2015  . Fibromyalgia 07/01/2015  . H/O cesarean section 01/17/2015  . Leg numbness 01/17/2015  . Maternal obesity syndrome 01/17/2015  . Dysmenorrhea 01/17/2015  . Bleeding postpartum 01/17/2015    Past Surgical History:  Procedure Laterality Date  . CESAREAN SECTION    . TUBAL LIGATION      Prior to Admission medications   Medication Sig Start Date End Date Taking? Authorizing Provider  albuterol (PROVENTIL HFA;VENTOLIN HFA) 108 (90 BASE) MCG/ACT inhaler Inhale 1-2 puffs into the lungs every 6 (six) hours as needed for wheezing or shortness of breath. 05/01/15   Payton Mccallum, MD   buprenorphine (BUTRANS - DOSED MCG/HR) 5 MCG/HR PTWK patch Place 1 patch (5 mcg total) onto the skin once a week. 07/10/15   Tod Persia, MD  docusate sodium (COLACE) 100 MG capsule Take 1 capsule (100 mg total) by mouth 2 (two) times daily. Patient not taking: Reported on 07/03/2015 05/26/15   Hildred Laser, MD  DULoxetine (CYMBALTA) 30 MG capsule Take 30 mg by mouth 2 (two) times daily.    [provider]  hydrOXYzine (ATARAX/VISTARIL) 50 MG tablet Take 1 tablet (50 mg total) by mouth 3 (three) times daily as needed for itching. 07/02/15   Joni Reining, PA-C  meloxicam (MOBIC) 7.5 MG tablet Take 1 tablet (7.5 mg total) by mouth 2 (two) times daily after a meal. 07/01/15   Tod Persia, MD  methylPREDNISolone (MEDROL DOSEPAK) 4 MG TBPK tablet Take Tapered dose as directed Patient not taking: Reported on 07/08/2015 07/02/15   Joni Reining, PA-C  oxyCODONE-acetaminophen (PERCOCET) 7.5-325 MG per tablet Take 1 tablet by mouth every 6 (six) hours as needed for moderate pain or severe pain (May take up to 2 tablets as needed for pain.). Patient not taking: Reported on 07/10/2015 06/23/15   Hildred Laser, MD    Allergies Ceclor [cefaclor], Clindamycin/lincomycin, and Ritalin [methylphenidate]  Family History  Problem Relation Age of Onset  . Diabetes Mother   . Asthma Mother   . Thyroid disease Mother   . Heart disease Father     Social History Social History   Tobacco Use  . Smoking status: Current Every Day Smoker  Packs/day: 0.25    Types: Cigarettes  Substance Use Topics  . Alcohol use: No  . Drug use: No    Review of Systems Constitutional: No fever/chills Eyes: No visual changes. ENT: No sore throat. Cardiovascular: Positive for chest tightness.  Respiratory: Positive for cough and shortness of breath. Gastrointestinal: No abdominal pain.  No nausea, no vomiting.  No diarrhea.   Genitourinary: Negative for dysuria. Musculoskeletal: Negative for back  pain. Skin: Negative for rash. Neurological: Negative for headaches, focal weakness or numbness.  ____________________________________________   PHYSICAL EXAM:  VITAL SIGNS: ED Triage Vitals  Enc Vitals Group     BP 02/23/21 1845 130/89     Pulse Rate 02/23/21 1843 88     Resp 02/23/21 1843 20     Temp 02/23/21 1843 99 F (37.2 C)     Temp src --      SpO2 02/23/21 1843 100 %     Weight 02/23/21 1843 173 lb (78.5 kg)     Height 02/23/21 1843 4\' 11"  (1.499 m)     Head Circumference --      Peak Flow --      Pain Score 02/23/21 1843 8   Constitutional: Alert and oriented.  Eyes: Conjunctivae are normal.  ENT      Head: Normocephalic and atraumatic.      Nose: No congestion/rhinnorhea.      Mouth/Throat: Mucous membranes are moist.      Neck: No stridor. Hematological/Lymphatic/Immunilogical: No cervical lymphadenopathy. Cardiovascular: Normal rate, regular rhythm.  No murmurs, rubs, or gallops.  Respiratory: Normal respiratory effort without tachypnea nor retractions. Bilateral expiratory wheezing in lung bases.  Gastrointestinal: Soft and non tender. No rebound. No guarding.  Genitourinary: Deferred Musculoskeletal: Normal range of motion in all extremities. No lower extremity edema. Neurologic:  Normal speech and language. No gross focal neurologic deficits are appreciated.  Skin:  Skin is warm, dry and intact. No rash noted. Psychiatric: Mood and affect are normal. Speech and behavior are normal. Patient exhibits appropriate insight and judgment.  ____________________________________________    LABS (pertinent positives/negatives)  upreg negative COVID negative BMP wnl CBC wbc 14.7, hgb 12.0, plt 273  ____________________________________________   EKG  I, 04/25/21, attending physician, personally viewed and interpreted this EKG  EKG Time: 1848 Rate: 90 Rhythm: sinus rhythm Axis: normal Intervals: qtc 415 QRS: narrow ST changes: no st  elevation Impression: normal ekg  ____________________________________________    RADIOLOGY  CXR No active disease  ____________________________________________   PROCEDURES  Procedures  ____________________________________________   INITIAL IMPRESSION / ASSESSMENT AND PLAN / ED COURSE  Pertinent labs & imaging results that were available during my care of the patient were reviewed by me and considered in my medical decision making (see chart for details).   Patient presented to the emergency department today primarily for complaint of shortness of breath and cough.  Patient does have a history of asthma.  On exam she does have some wheezing heard primarily in the lower lung bases bilaterally.  Patient is not hypoxic.  Patient was given steroids as well as breathing treatments here.  She stated she did feel better.  At this point I think likely asthma exacerbation.  Additionally patient had complained of some dental pain.  Given concern for possible dental infection will give antibiotics.  Patient states she has follow-up next week.  ____________________________________________   FINAL CLINICAL IMPRESSION(S) / ED DIAGNOSES  Final diagnoses:  Exacerbation of asthma, unspecified asthma severity, unspecified whether persistent  Note: This dictation was prepared with Dragon dictation. Any transcriptional errors that result from this process are unintentional     Phineas Semen, MD 02/23/21 2300

## 2021-05-26 IMAGING — DX DG CHEST 1V PORT
1 series · 1 of 1 positions shown · non-contrast
Comparison: None.

CLINICAL DATA: Shortness of breath

EXAM:
PORTABLE CHEST 1 VIEW

[chest ap]
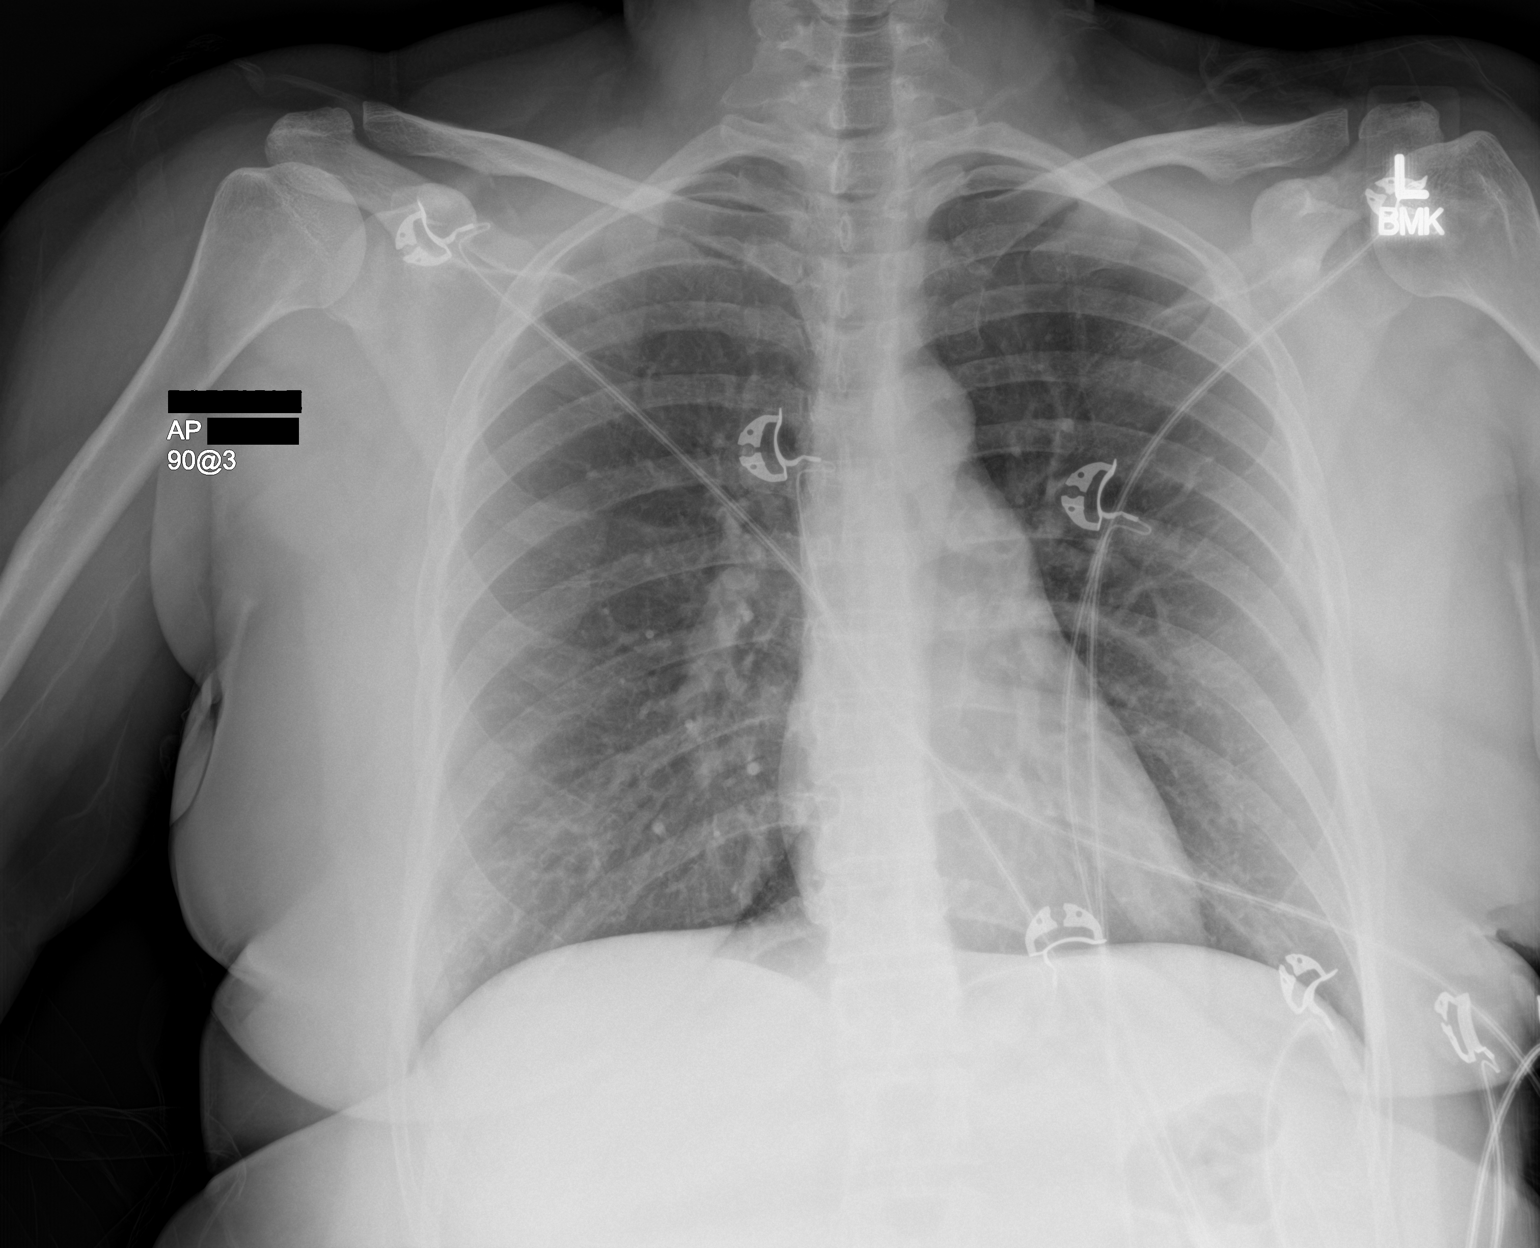

[1 of 1 positions shown; findings below may reference images not displayed]

FINDINGS: The heart size and mediastinal contours are within normal limits.
Both lungs are clear. The visualized skeletal structures are
unremarkable.
IMPRESSION: No active disease.

## 2023-11-20 ENCOUNTER — Other Ambulatory Visit: Payer: Self-pay

## 2023-11-20 ENCOUNTER — Emergency Department (HOSPITAL_COMMUNITY)
Admission: EM | Admit: 2023-11-20 | Discharge: 2023-11-20 | Disposition: A | Discharge status: Home / Self Care | Payer: Medicaid Other | Attending: Emergency Medicine | Admitting: Emergency Medicine

## 2023-11-20 ENCOUNTER — Emergency Department (HOSPITAL_COMMUNITY): Payer: Medicaid Other

## 2023-11-20 ENCOUNTER — Encounter (HOSPITAL_COMMUNITY): Payer: Self-pay

## 2023-11-20 DIAGNOSIS — U071 COVID-19: Secondary | ICD-10-CM | POA: Diagnosis not present

## 2023-11-20 DIAGNOSIS — R1110 Vomiting, unspecified: Secondary | ICD-10-CM

## 2023-11-20 DIAGNOSIS — E860 Dehydration: Secondary | ICD-10-CM | POA: Insufficient documentation

## 2023-11-20 LAB — CBC WITH DIFFERENTIAL/PLATELET
Abs Immature Granulocytes: 0.01 10*3/uL (ref 0.00–0.07)
Basophils Absolute: 0.1 10*3/uL (ref 0.0–0.1)
Basophils Relative: 1 %
Eosinophils Absolute: 0 10*3/uL (ref 0.0–0.5)
Eosinophils Relative: 0 %
HCT: 38.3 % (ref 36.0–46.0)
Hemoglobin: 12.9 g/dL (ref 12.0–15.0)
Immature Granulocytes: 0 %
Lymphocytes Relative: 20 %
Lymphs Abs: 1.2 10*3/uL (ref 0.7–4.0)
MCH: 30.4 pg (ref 26.0–34.0)
MCHC: 33.7 g/dL (ref 30.0–36.0)
MCV: 90.3 fL (ref 80.0–100.0)
Monocytes Absolute: 1.1 10*3/uL — ABNORMAL HIGH (ref 0.1–1.0)
Monocytes Relative: 18 %
Neutro Abs: 3.6 10*3/uL (ref 1.7–7.7)
Neutrophils Relative %: 61 %
Platelets: 201 10*3/uL (ref 150–400)
RBC: 4.24 MIL/uL (ref 3.87–5.11)
RDW: 11.8 % (ref 11.5–15.5)
WBC: 5.9 10*3/uL (ref 4.0–10.5)
nRBC: 0 % (ref 0.0–0.2)

## 2023-11-20 LAB — BASIC METABOLIC PANEL
Anion gap: 12 (ref 5–15)
BUN: <5 mg/dL — ABNORMAL LOW (ref 6–20)
CO2: 21 mmol/L — ABNORMAL LOW (ref 22–32)
Calcium: 8.8 mg/dL — ABNORMAL LOW (ref 8.9–10.3)
Chloride: 104 mmol/L (ref 98–111)
Creatinine, Ser: 0.76 mg/dL (ref 0.44–1.00)
GFR, Estimated: >60 mL/min (ref >60)
Glucose, Bld: 89 mg/dL (ref 70–99)
Potassium: 3.7 mmol/L (ref 3.5–5.1)
Sodium: 137 mmol/L (ref 135–145)

## 2023-11-20 MED ORDER — KETOROLAC TROMETHAMINE 15 MG/ML IJ SOLN
15.0000 mg | Freq: Once | INTRAMUSCULAR | Status: AC
  Administered 2023-11-20: 15 mg via INTRAVENOUS
  Filled 2023-11-20: qty 1

## 2023-11-20 MED ORDER — ONDANSETRON HCL 4 MG/2ML IJ SOLN
4.0000 mg | Freq: Once | INTRAMUSCULAR | Status: AC
  Administered 2023-11-20: 4 mg via INTRAVENOUS
  Filled 2023-11-20: qty 2

## 2023-11-20 MED ORDER — ONDANSETRON 4 MG PO TBDP: ORAL_TABLET | ORAL | 0 refills | Status: AC

## 2023-11-20 MED ORDER — IBUPROFEN 600 MG PO TABS: 600.0000 mg | ORAL_TABLET | Freq: Four times a day (QID) | ORAL | 0 refills | Status: AC | PRN

## 2023-11-20 MED ORDER — SODIUM CHLORIDE 0.9 % IV BOLUS
1000.0000 mL | Freq: Once | INTRAVENOUS | Status: AC
  Administered 2023-11-20: 1000 mL via INTRAVENOUS

## 2023-11-20 NOTE — ED Provider Triage Note (Signed)
Emergency Medicine Provider Triage Evaluation Note  Leslie Rojas , a 38 y.o. female  was evaluated in triage.  Pt complains of body aches, chest tightness, SOB, chills, headache starting last night. Home test for COVID +.   Review of Systems  Positive: As above Negative:   Physical Exam  Ht 4\' 11"  (1.499 m)   Wt 78.5 kg   BMI 34.95 kg/m  Gen:   Awake, no distress   Resp:  Normal effort  MSK:   Moves extremities without difficulty  Other:    Medical Decision Making  Medically screening exam initiated at 1:19 PM.  Appropriate orders placed.  Leslie Rojas was informed that the remainder of the evaluation will be completed by another provider, this initial triage assessment does not replace that evaluation, and the importance of remaining in the ED until their evaluation is complete.     Leslie Rojas T, PA-C 11/20/23 1320

## 2023-11-20 NOTE — ED Triage Notes (Signed)
Pt states she tested COVID positive with home test and having generalized bodyaches, chest tightness, SOB, chills and HA started last night.

## 2023-11-20 NOTE — ED Notes (Signed)
 To CT

## 2023-11-20 NOTE — ED Provider Notes (Signed)
Leslie Rojas Provider Note   CSN: [id number] Arrival date & time: 11/20/23  1220     History  Chief Complaint  Patient presents with   bodyaches   Covid Positive    Leslie Rojas is a 38 y.o. female.  Patient presents with headache, body aches, shortness of breath, chills, fever and positive home COVID test.  No known sick contacts.  Kids in the home.  No neurologic complaints.  Patient generally feels malaise.  No urinary symptoms.  Patient had 5 episodes of vomiting while waiting to be seen nonbloody.  The history is provided by the patient.       Home Medications Prior to Admission medications   Medication Sig Start Date End Date Taking? Authorizing Provider  ibuprofen (ADVIL) 600 MG tablet Take 1 tablet (600 mg total) by mouth every 6 (six) hours as needed for headache, fever or moderate pain (pain score 4-6). 11/20/23  Yes Blane Ohara, MD  ondansetron (ZOFRAN-ODT) 4 MG disintegrating tablet 4mg  ODT q6 hours prn nausea/vomit 11/20/23  Yes Blane Ohara, MD  albuterol (PROVENTIL HFA;VENTOLIN HFA) 108 (90 BASE) MCG/ACT inhaler Inhale 1-2 puffs into the lungs every 6 (six) hours as needed for wheezing or shortness of breath. 05/01/15   Payton Mccallum, MD  albuterol (PROVENTIL) (2.5 MG/3ML) 0.083% nebulizer solution Take 3 mLs (2.5 mg total) by nebulization every 4 (four) hours as needed for wheezing or shortness of breath. 02/23/21 02/23/22  Phineas Semen, MD  albuterol (VENTOLIN HFA) 108 (90 Base) MCG/ACT inhaler Inhale 2 puffs into the lungs every 6 (six) hours as needed for wheezing or shortness of breath. 02/23/21   Phineas Semen, MD  buprenorphine (BUTRANS - DOSED MCG/HR) 5 MCG/HR PTWK patch Place 1 patch (5 mcg total) onto the skin once a week. 07/10/15   Tod Persia, MD  docusate sodium (COLACE) 100 MG capsule Take 1 capsule (100 mg total) by mouth 2 (two) times daily. Patient not taking: Reported on 07/03/2015 05/26/15    Hildred Laser, MD  DULoxetine (CYMBALTA) 30 MG capsule Take 30 mg by mouth 2 (two) times daily.    [provider]  hydrOXYzine (ATARAX/VISTARIL) 50 MG tablet Take 1 tablet (50 mg total) by mouth 3 (three) times daily as needed for itching. 07/02/15   Joni Reining, PA-C  meloxicam (MOBIC) 7.5 MG tablet Take 1 tablet (7.5 mg total) by mouth 2 (two) times daily after a meal. 07/01/15   Tod Persia, MD  methylPREDNISolone (MEDROL DOSEPAK) 4 MG TBPK tablet Take Tapered dose as directed Patient not taking: Reported on 07/08/2015 07/02/15   Joni Reining, PA-C  oxyCODONE-acetaminophen (PERCOCET) 7.5-325 MG per tablet Take 1 tablet by mouth every 6 (six) hours as needed for moderate pain or severe pain (May take up to 2 tablets as needed for pain.). Patient not taking: Reported on 07/10/2015 06/23/15   Hildred Laser, MD  predniSONE (STERAPRED UNI-PAK 21 TAB) 10 MG (21) TBPK tablet Per packaging instructions 02/23/21   Phineas Semen, MD      Allergies    Ceclor [cefaclor], Clindamycin/lincomycin, and Ritalin [methylphenidate]    Review of Systems   Review of Systems  Constitutional:  Positive for chills and fever.  HENT:  Positive for congestion.   Eyes:  Negative for visual disturbance.  Respiratory:  Positive for cough and shortness of breath.   Cardiovascular:  Negative for leg swelling.  Gastrointestinal:  Positive for nausea and vomiting. Negative for abdominal pain.  Genitourinary:  Negative for dysuria and flank pain.  Musculoskeletal:  Negative for back pain, neck pain and neck stiffness.  Skin:  Negative for rash.  Neurological:  Negative for light-headedness and headaches.    Physical Exam Updated Vital Signs BP 123/87 (BP Location: Right Arm)   Pulse 86   Temp 100.1 F (37.8 C) (Oral)   Resp 18   Ht 4\' 11"  (1.499 m)   Wt 78.5 kg   LMP 11/09/2023   SpO2 100%   BMI 34.95 kg/m  Physical Exam Vitals and nursing note reviewed.  Constitutional:      General: She  is not in acute distress.    Appearance: She is well-developed.  HENT:     Head: Normocephalic and atraumatic.     Nose: Congestion present.     Mouth/Throat:     Mouth: Mucous membranes are dry.  Eyes:     General:        Right eye: No discharge.        Left eye: No discharge.     Conjunctiva/sclera: Conjunctivae normal.  Neck:     Trachea: No tracheal deviation.  Cardiovascular:     Rate and Rhythm: Normal rate and regular rhythm.     Heart sounds: No murmur heard. Pulmonary:     Effort: Pulmonary effort is normal.     Breath sounds: Normal breath sounds.  Abdominal:     General: There is no distension.     Palpations: Abdomen is soft.     Tenderness: There is no abdominal tenderness. There is no guarding.  Musculoskeletal:     Cervical back: Normal range of motion and neck supple. No rigidity.  Skin:    General: Skin is warm.     Capillary Refill: Capillary refill takes less than 2 seconds.     Findings: No rash.  Neurological:     General: No focal deficit present.     Mental Status: She is alert.     Cranial Nerves: No cranial nerve deficit.  Psychiatric:        Mood and Affect: Mood normal.     ED Results / Procedures / Treatments   Labs (all labs ordered are listed, but only abnormal results are displayed) Labs Reviewed  CBC WITH DIFFERENTIAL/PLATELET - Abnormal; Notable for the following components:      Result Value   Monocytes Absolute 1.1 (*)    All other components within normal limits  BASIC METABOLIC PANEL - Abnormal; Notable for the following components:   CO2 21 (*)    BUN <5 (*)    Calcium 8.8 (*)    All other components within normal limits    EKG None  Radiology DG Chest 2 View Result Date: 11/20/2023 CLINICAL DATA:  Chest tightness.  COVID positive. EXAM: CHEST - 2 VIEW COMPARISON:  02/23/2021. FINDINGS: Bilateral lung fields are clear. Bilateral costophrenic angles are clear. Normal cardio-mediastinal silhouette. No acute osseous  abnormalities. The soft tissues are within normal limits. IMPRESSION: No active cardiopulmonary disease. Electronically Signed   By: Jules Schick M.D.   On: 11/20/2023 15:27    Procedures Procedures    Medications Ordered in ED Medications  sodium chloride 0.9 % bolus 1,000 mL (1,000 mLs Intravenous New Bag/Given 11/20/23 1945)  ondansetron (ZOFRAN) injection 4 mg (4 mg Intravenous Given 11/20/23 2026)  ketorolac (TORADOL) 15 MG/ML injection 15 mg (15 mg Intravenous Given 11/20/23 1945)    ED Course/ Medical Decision Making/ A&P  Medical Decision Making Amount and/or Complexity of Data Reviewed Labs: ordered.  Risk Prescription drug management.   Patient presents with flulike/viral syndrome.  Patient overall is clear lungs, normal work of breathing normal oxygenation.  Low-grade temperature, Tylenol ordered.  With recurrent vomiting and generally feeling unwell and signs of dehydration plan for IV fluids, blood work, antiemetics and Toradol for discomfort.  Patient comfortable plan.  Blood work independently reviewed white count, hemoglobin unremarkable no signs significant anemia.  Electrolytes and kidney function unremarkable no signs of severe dehydration.  Patient improved with IV fluids and oral fluids given.  Plan for Zofran and ibuprofen outpatient follow-up.        Final Clinical Impression(s) / ED Diagnoses Final diagnoses:  COVID-19  Vomiting in adult  Dehydration    Rx / DC Orders ED Discharge Orders          Ordered    ibuprofen (ADVIL) 600 MG tablet  Every 6 hours PRN        11/20/23 1942    ondansetron (ZOFRAN-ODT) 4 MG disintegrating tablet        11/20/23 1942              Blane Ohara, MD 11/20/23 2122

## 2023-11-20 NOTE — ED Notes (Signed)
Pt back to waiting room.

## 2023-11-20 NOTE — Discharge Instructions (Signed)
Use Zofran every 6 hours as needed for nausea and vomiting. Use Tylenol every 4 hours and ibuprofen every 6 hours needed for pain or fever.
